# Patient Record
Sex: Female | Born: 1945 | Race: White | Hispanic: No | Marital: Married | Smoking: Former smoker
Health system: Southern US, Community
[De-identification: ages and names within clinical notes are randomized; demographics above are authoritative.]

## PROBLEM LIST (undated history)

## (undated) DIAGNOSIS — E559 Vitamin D deficiency, unspecified: Secondary | ICD-10-CM

## (undated) DIAGNOSIS — G309 Alzheimer's disease, unspecified: Secondary | ICD-10-CM

## (undated) DIAGNOSIS — E785 Hyperlipidemia, unspecified: Secondary | ICD-10-CM

## (undated) DIAGNOSIS — F0280 Dementia in other diseases classified elsewhere without behavioral disturbance: Secondary | ICD-10-CM

## (undated) DIAGNOSIS — M1990 Unspecified osteoarthritis, unspecified site: Secondary | ICD-10-CM

## (undated) DIAGNOSIS — E079 Disorder of thyroid, unspecified: Secondary | ICD-10-CM

## (undated) DIAGNOSIS — Z86010 Personal history of colonic polyps: Principal | ICD-10-CM

## (undated) HISTORY — DX: Disorder of thyroid, unspecified: E07.9

## (undated) HISTORY — DX: Vitamin D deficiency, unspecified: E55.9

## (undated) HISTORY — DX: Dementia in other diseases classified elsewhere, unspecified severity, without behavioral disturbance, psychotic disturbance, mood disturbance, and anxiety: F02.80

## (undated) HISTORY — DX: Personal history of colonic polyps: Z86.010

## (undated) HISTORY — DX: Alzheimer's disease, unspecified: G30.9

## (undated) HISTORY — DX: Unspecified osteoarthritis, unspecified site: M19.90

## (undated) HISTORY — DX: Hyperlipidemia, unspecified: E78.5

---

## 2009-07-12 ENCOUNTER — Encounter: Admission: RE | Admit: 2009-07-12 | Discharge: 2009-07-12 | Payer: Self-pay | Admitting: Family Medicine

## 2009-09-20 ENCOUNTER — Encounter (INDEPENDENT_AMBULATORY_CARE_PROVIDER_SITE_OTHER): Payer: Self-pay | Admitting: *Deleted

## 2009-09-22 ENCOUNTER — Ambulatory Visit: Payer: Self-pay | Admitting: Internal Medicine

## 2009-10-04 ENCOUNTER — Ambulatory Visit: Payer: Self-pay | Admitting: Internal Medicine

## 2009-10-04 DIAGNOSIS — Z860100 Personal history of colon polyps, unspecified: Secondary | ICD-10-CM | POA: Insufficient documentation

## 2009-10-04 HISTORY — PX: COLONOSCOPY: SHX174

## 2009-10-07 ENCOUNTER — Encounter: Payer: Self-pay | Admitting: Internal Medicine

## 2009-10-25 ENCOUNTER — Encounter: Admission: RE | Admit: 2009-10-25 | Discharge: 2009-10-25 | Payer: Self-pay | Admitting: Obstetrics and Gynecology

## 2010-05-02 ENCOUNTER — Encounter: Admission: RE | Admit: 2010-05-02 | Discharge: 2010-05-02 | Payer: Self-pay | Admitting: Obstetrics & Gynecology

## 2010-07-13 ENCOUNTER — Encounter: Admission: RE | Admit: 2010-07-13 | Discharge: 2010-07-13 | Payer: Self-pay | Admitting: Family Medicine

## 2010-07-17 ENCOUNTER — Encounter: Admission: RE | Admit: 2010-07-17 | Payer: Self-pay | Admitting: Endocrinology

## 2010-09-06 IMAGING — US US SOFT TISSUE HEAD/NECK
1 series · 14 of 20 positions shown · non-contrast
Comparison: None.

CLINICAL DATA: Enlarged thyroid

THYROID ULTRASOUND
TECHNIQUE: Ultrasound examination of the thyroid gland and
adjacent soft tissues was performed.

[Series 1: us soft tissue head/neck · 0.08mm/px · 14 of 20 slices shown]
[im 1/20]
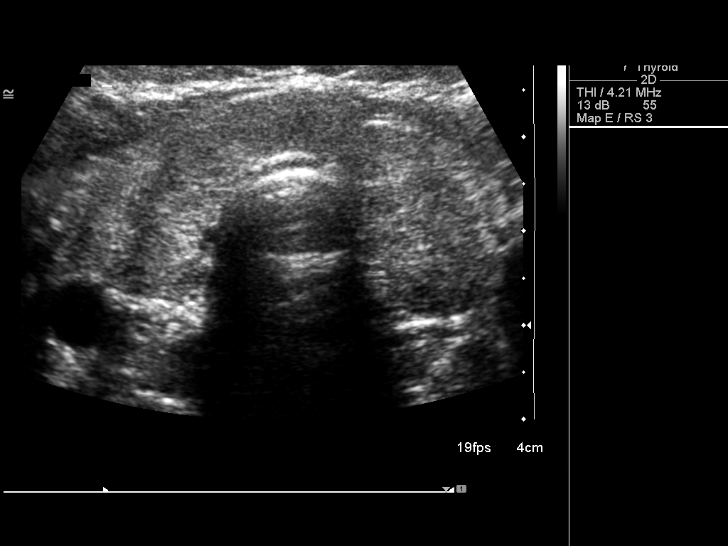
[im 3/20]
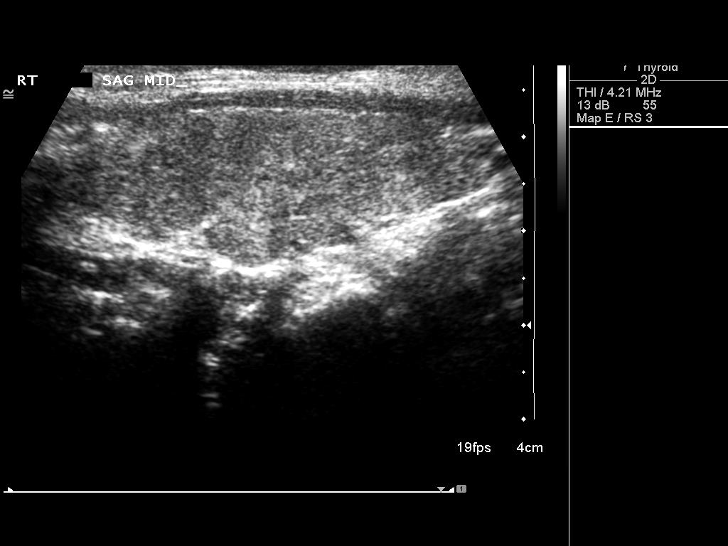
[im 4/20]
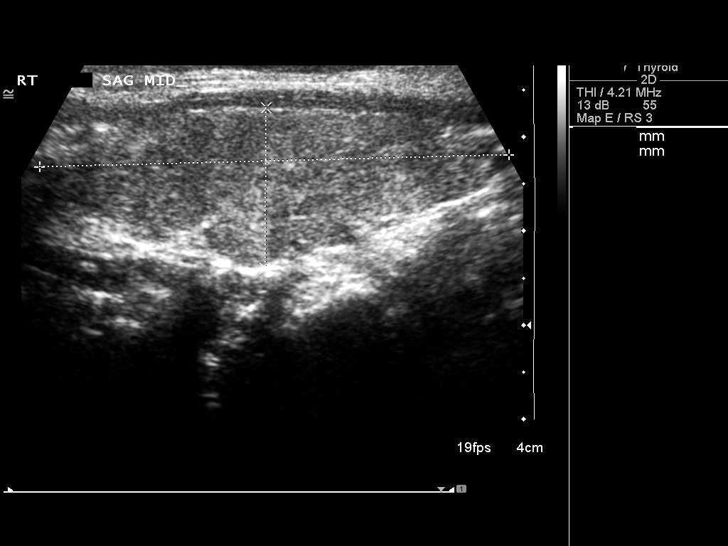
[im 6/20]
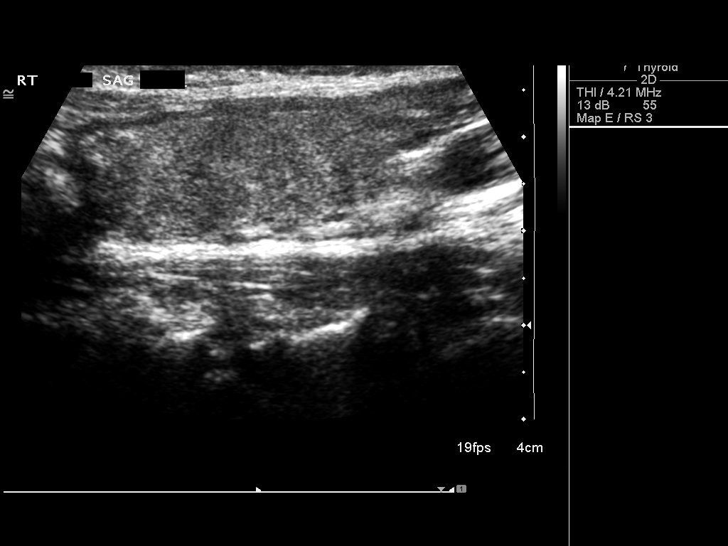
[im 7/20]
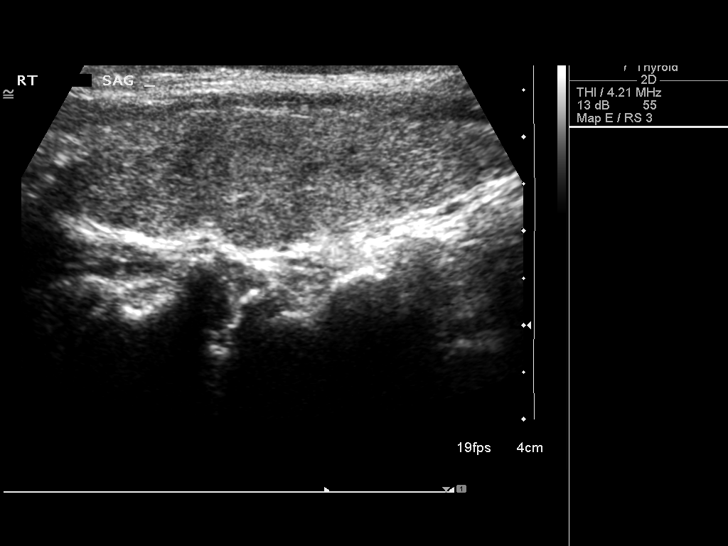
[im 8/20]
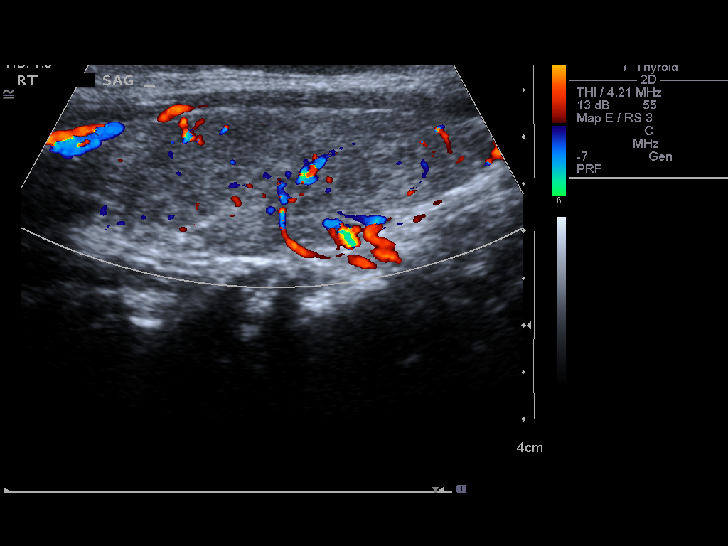
[im 10/20]
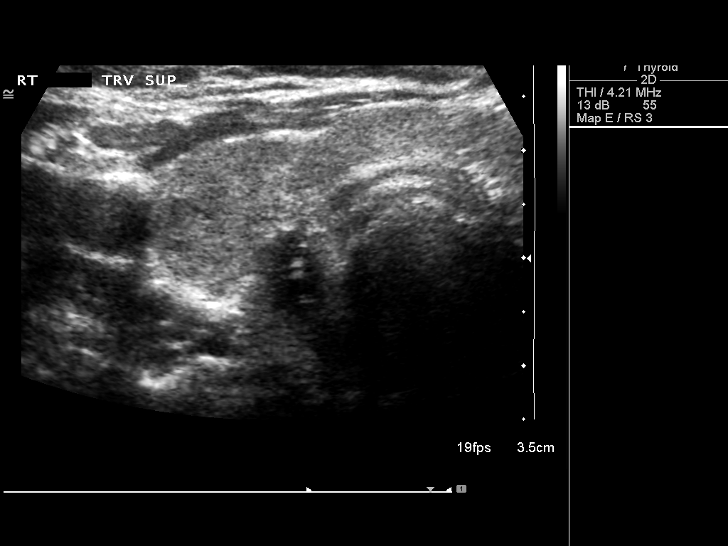
[im 11/20]
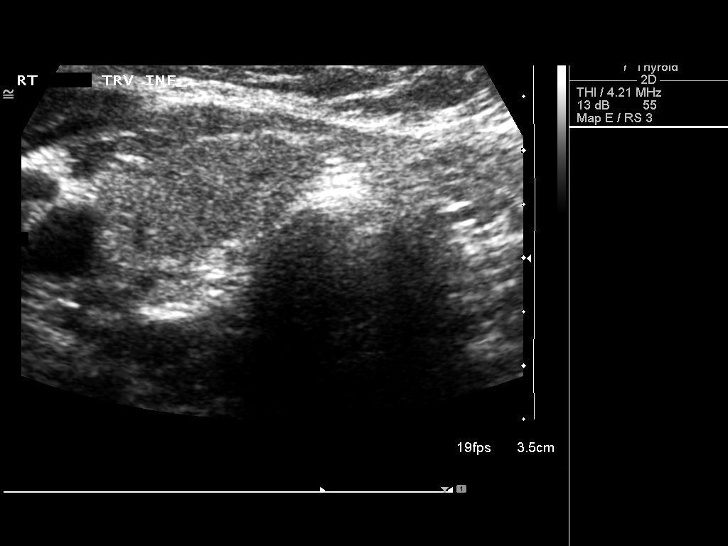
[im 13/20]
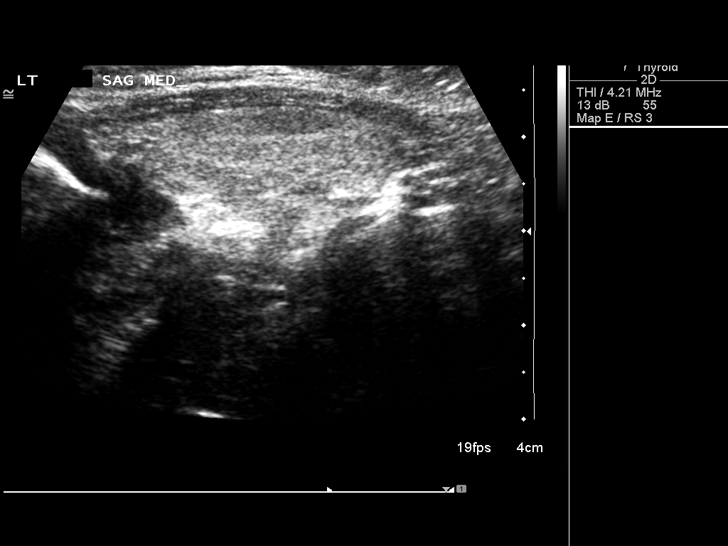
[im 14/20]
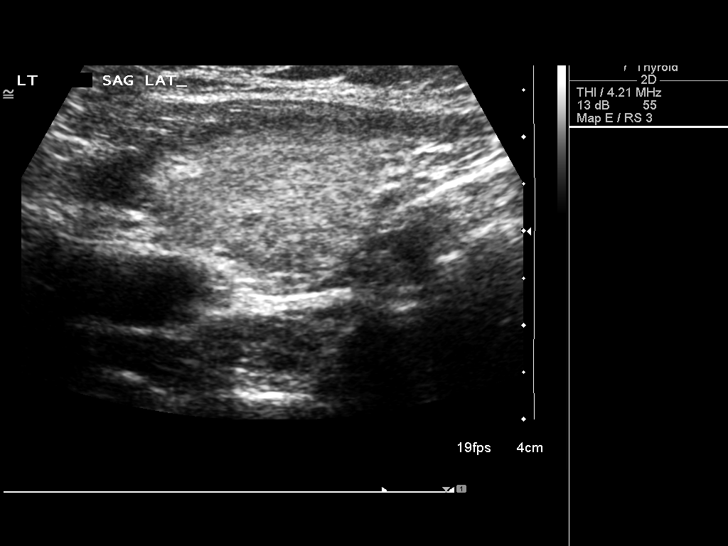
[im 16/20]
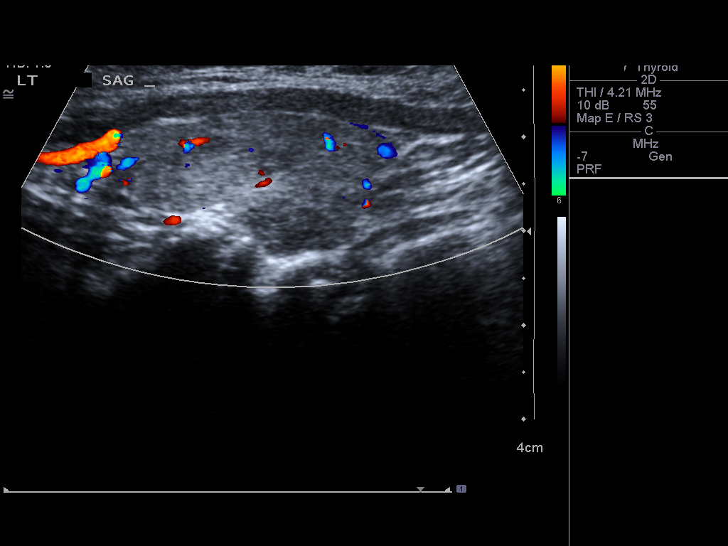
[im 17/20]
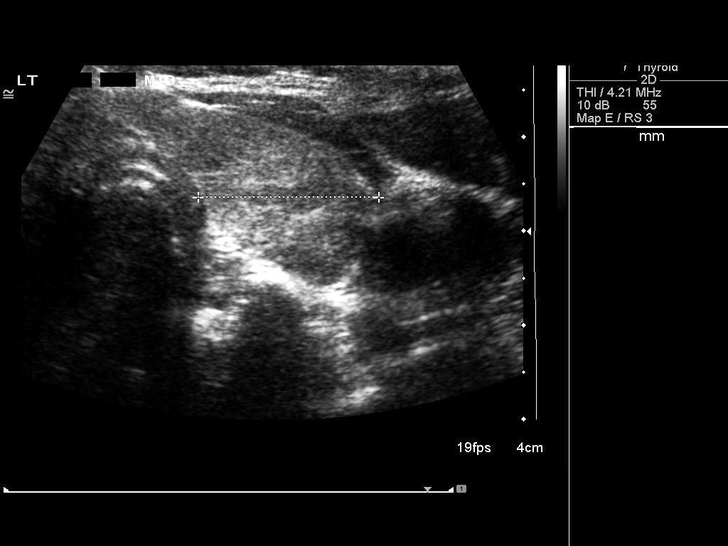
[im 18/20]
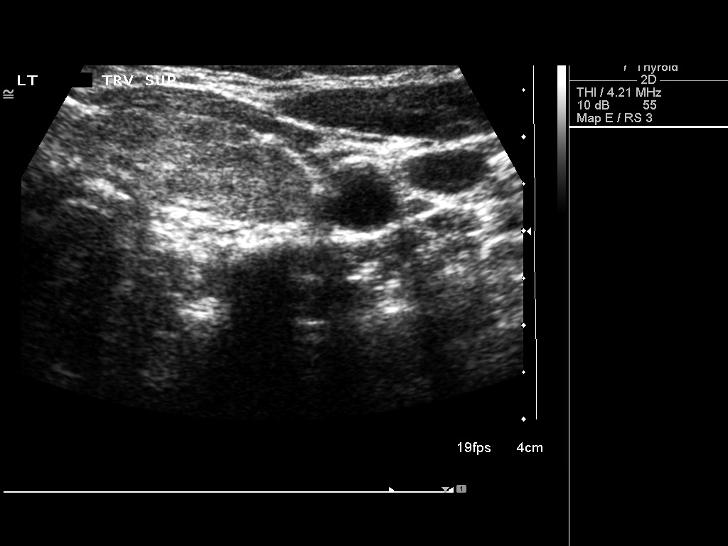
[im 20/20]
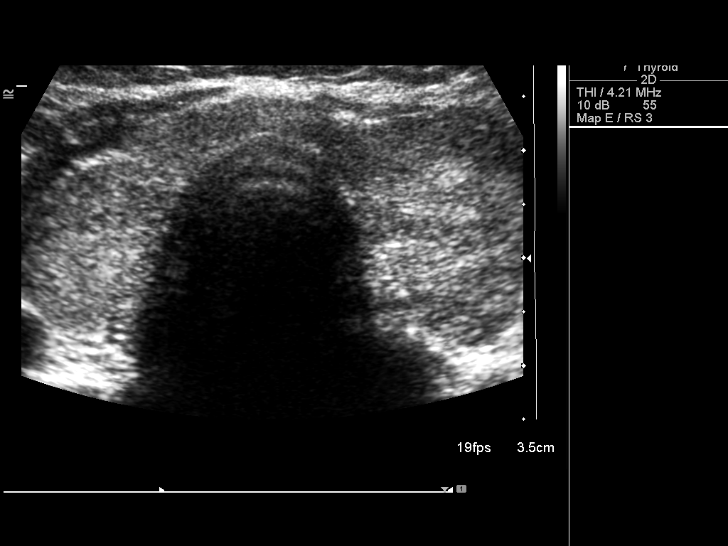

[14 of 20 positions shown; findings below may reference images not displayed]

FINDINGS: The isthmus is 6.8 mm in thickness without focal lesion.

Right lobe 17 x 18 x 15 mm, somewhat inhomogeneous in echotexture
without discrete lesion.

Left lobe 17 x 19 x 43 mm, similarly inhomogeneous in echotexture
without discrete lesion.
IMPRESSION: 1.  Mild thyromegaly without discrete or dominant mass.

## 2010-10-07 ENCOUNTER — Encounter: Payer: Self-pay | Admitting: Endocrinology

## 2010-10-17 NOTE — Letter (Signed)
Summary: [facility] Instructions  [facility]  [address] [place], [place] [address]   Phone: [phone]  Fax: [phone]       Meagan Schwartz    Dec 01, 1945    MRN: [id number]        Procedure Day /Date: 10/04/09   Tuesday     Arrival Time:  9:00am     Procedure Time: 10:00am     Location of Procedure:                    _ x_  [facility] (4th Floor)                        PREPARATION FOR COLONOSCOPY WITH MOVIPREP   Starting 5 days prior to your procedure _1/13/11 _ do not eat nuts, seeds, popcorn, corn, beans, peas,  salads, or any raw vegetables.  Do not take any fiber supplements (e.g. Metamucil, Citrucel, and Benefiber).  THE DAY BEFORE YOUR PROCEDURE         DATE: 10/03/09 DAY: Monday  1.  Drink clear liquids the entire day-NO SOLID FOOD  2.  Do not drink anything colored red or purple.  Avoid juices with pulp.  No orange juice.  3.  Drink at least 64 oz. (8 glasses) of fluid/clear liquids during the day to prevent dehydration and help the prep work efficiently.  CLEAR LIQUIDS INCLUDE: Water Jello Ice Popsicles Tea (sugar ok, no milk/cream) Powdered fruit flavored drinks Coffee (sugar ok, no milk/cream) Gatorade Juice: apple, white grape, white cranberry  Lemonade Clear bullion, consomm, broth Carbonated beverages (any kind) Strained chicken noodle soup Hard Candy                             4.  In the morning, mix first dose of MoviPrep solution:    Empty 1 Pouch A and 1 Pouch B into the disposable container    Add lukewarm drinking water to the top line of the container. Mix to dissolve    Refrigerate (mixed solution should be used within 24 hrs)  5.  Begin drinking the prep at 5:00 p.m. The MoviPrep container is divided by 4 marks.   Every 15 minutes drink the solution down to the next mark (approximately 8 oz) until the full liter is complete.   6.  Follow completed prep with 16 oz of clear liquid of your choice  (Nothing red or purple).  Continue to drink clear liquids until bedtime.  7.  Before going to bed, mix second dose of MoviPrep solution:    Empty 1 Pouch A and 1 Pouch B into the disposable container    Add lukewarm drinking water to the top line of the container. Mix to dissolve    Refrigerate  THE DAY OF YOUR PROCEDURE      DATE: 10/04/09 DAY: Tuesday  Beginning at  5:00a.m. (5 hours before procedure):         1. Every 15 minutes, drink the solution down to the next mark (approx 8 oz) until the full liter is complete.  2. Follow completed prep with 16 oz. of clear liquid of your choice.    3. You may drink clear liquids until 8:00AM (2 HOURS BEFORE PROCEDURE).   MEDICATION INSTRUCTIONS  Unless otherwise instructed, you should take regular prescription medications with a small sip of water   as early as possible the morning of  your procedure.         OTHER INSTRUCTIONS  You will need a responsible adult at least 64 years of age to accompany you and drive you home.   This person must remain in the waiting room during your procedure.  Wear loose fitting clothing that is easily removed.  Leave jewelry and other valuables at home.  However, you may wish to bring a book to read or  an iPod/MP3 player to listen to music as you wait for your procedure to start.  Remove all body piercing jewelry and leave at home.  Total time from sign-in until discharge is approximately 2-3 hours.  You should go home directly after your procedure and rest.  You can resume normal activities the  day after your procedure.  The day of your procedure you should not:   Drive   Make legal decisions   Operate machinery   Drink alcohol   Return to work  You will receive specific instructions about eating, activities and medications before you leave.    The above instructions have been reviewed and explained to me by Wyona Almas RN  September 22, 2009 10:21 AM     I fully  understand and can verbalize these instructions _____________________________ Date _________

## 2010-10-17 NOTE — Letter (Signed)
Summary: Patient Notice- Polyp Results  [facility]  [address] [place], [place] [address]   Phone: [phone]  Fax: [phone]        October 07, 2009 MRN: [id number]    [facility] [address] [place], [place]  [address]    Dear Ms. Burbridge,  The polyps removed from your colon were adenomatous. This means that they were pre-cancerous or that  they had the potential to change into cancer over time.  I recommend that you have a repeat colonoscopy in 5 years to determine if you have developed any new polyps over time. If you develop any new rectal bleeding, abdominal pain or significant bowel habit changes, please contact us before then.  Should you develop new or worsening symptoms of abdominal pain, bowel habit changes or bleeding from the rectum or bowels, please schedule an evaluation with either your primary care physician or with me.  Please call us if you are having persistent problems or have questions about your condition that have not been fully answered at this time.  Sincerely,  Iva Boop MD, [facility]  This letter has been electronically signed by your physician.  Appended Document: Patient Notice- Polyp Results Letter mailed 1.24.11.

## 2010-10-17 NOTE — Procedures (Signed)
Summary: Colonoscopy  Patient: Meagan Schwartz Note: All result statuses are Final unless otherwise noted.  Tests: (1) Colonoscopy (COL)   COL Colonoscopy           DONE     [facility]     [address]     [place], [place]  [address]           COLONOSCOPY PROCEDURE REPORT           PATIENT:  Meagan Schwartz, Meagan Schwartz  MR#:  [id number]     BIRTHDATE:  07-18-1946, 63 yrs. old  GENDER:  female           ENDOSCOPIST:  Iva Boop, MD, [facility]     Referred by:  Catha Gosselin, M.D.           PROCEDURE DATE:  10/04/2009     PROCEDURE:  Colonoscopy with biopsy and snare polypectomy     ASA CLASS:  Class I     INDICATIONS:  Routine Risk Screening (mother had colon cancer but     developed in her 80's)           MEDICATIONS:   Fentanyl 75 mcg IV, Versed 7 mg IV           DESCRIPTION OF PROCEDURE:   After the risks benefits and     alternatives of the procedure were thoroughly explained, informed     consent was obtained.  Digital rectal exam was performed and     revealed no abnormalities.   The LB CF-[id number] endoscope     was introduced through the anus and advanced to the cecum, which     was identified by both the appendix and ileocecal valve, without     limitations.  The quality of the prep was excellent, using     MoviPrep.  The instrument was then slowly withdrawn as the colon     was fully examined. Insertion: 5:00 minutes Withdrawal: 13:51     minutes.     <<PROCEDUREIMAGES>>           FINDINGS:  Three polyps were found. 7mm flat ascending colon     polyp, 3 mm polyps in ascending and splenic flexure areas. The     polyps were removed using cold biopsy forceps and also  snared     without cautery. Retrieval was successful. snare polyp  Severe     diverticulosis was found in the sigmoid colon.  This was otherwise     a normal examination of the colon.   Retroflexed views in the     rectum revealed no abnormalities.    The scope was then withdrawn     from the patient  and the procedure completed.           COMPLICATIONS:  None           ENDOSCOPIC IMPRESSION:     1) Three polyps removed, maximum size 7 mm     2) Severe diverticulosis in the sigmoid colon     3) Otherwise normal examination, excellent prep           REPEAT EXAM:  In for Colonoscopy, pending biopsy results.           Iva Boop, MD, [facility]           CC:  Catha Gosselin, MD     The Patient           n.     eSIGNED:  Iva Boop at 10/04/2009 11:01 AM           [employer], Valentina Gu, [id number]  Note: An exclamation mark (!) indicates a result that was not dispersed into the flowsheet. Document Creation Date: 10/04/2009 11:01 AM _______________________________________________________________________  (1) Order result status: Final Collection or observation date-time: 10/04/2009 10:52 Requested date-time:  Receipt date-time:  Reported date-time:  Referring Physician:   Ordering Physician: Stan Head [phone]) Specimen Source:  Source: Launa Grill Order Number: [id number] Lab site:   Appended Document: Colonoscopy     Procedures Next Due Date:    Colonoscopy: 10/2014

## 2010-10-17 NOTE — Miscellaneous (Signed)
Summary: [facility] Previsit/prep  Clinical Lists Changes  Medications: Added new medication of MOVIPREP 100 GM  SOLR (PEG-KCL-NACL-NASULF-NA ASC-C) As per prep instructions. - Signed Rx of MOVIPREP 100 GM  SOLR (PEG-KCL-NACL-NASULF-NA ASC-C) As per prep instructions.;  #1 x 0;  Signed;  Entered by: Wyona Almas RN;  Authorized by: Iva Boop MD, [facility];  Method used: Electronically to [facility]  [phone]*, [address], [place], [place], [place]  [address], Ph: [phone] or [phone], Fax: [phone] Observations: Added new observation of NKA: T (09/22/2009 10:00)    Prescriptions: MOVIPREP 100 GM  SOLR (PEG-KCL-NACL-NASULF-NA ASC-C) As per prep instructions.  #1 x 0   Entered by:   Wyona Almas RN   Authorized by:   Iva Boop MD, [facility]   Signed by:   Wyona Almas RN on 09/22/2009   Method used:   Electronically to        [facility]  [phone]* (retail)       [address]       [place], [place]  [address]       Ph: [phone] or [phone]       Fax: [phone]   RxID:   [id number]

## 2011-06-21 ENCOUNTER — Other Ambulatory Visit: Payer: Self-pay | Admitting: Family Medicine

## 2011-06-21 DIAGNOSIS — Z1231 Encounter for screening mammogram for malignant neoplasm of breast: Secondary | ICD-10-CM

## 2011-07-17 ENCOUNTER — Ambulatory Visit
Admission: RE | Admit: 2011-07-17 | Discharge: 2011-07-17 | Disposition: A | Discharge status: Home / Self Care | Payer: BC Managed Care – PPO | Source: Ambulatory Visit | Attending: Family Medicine | Admitting: Family Medicine

## 2011-07-17 DIAGNOSIS — Z1231 Encounter for screening mammogram for malignant neoplasm of breast: Secondary | ICD-10-CM

## 2011-10-29 DIAGNOSIS — Z01419 Encounter for gynecological examination (general) (routine) without abnormal findings: Secondary | ICD-10-CM | POA: Diagnosis not present

## 2011-10-29 DIAGNOSIS — Z124 Encounter for screening for malignant neoplasm of cervix: Secondary | ICD-10-CM | POA: Diagnosis not present

## 2011-10-29 DIAGNOSIS — E559 Vitamin D deficiency, unspecified: Secondary | ICD-10-CM | POA: Diagnosis not present

## 2011-10-30 ENCOUNTER — Other Ambulatory Visit: Payer: Self-pay | Admitting: Obstetrics & Gynecology

## 2011-10-30 DIAGNOSIS — E049 Nontoxic goiter, unspecified: Secondary | ICD-10-CM

## 2011-11-05 ENCOUNTER — Inpatient Hospital Stay: Admission: RE | Admit: 2011-11-05 | Payer: BC Managed Care – PPO | Source: Ambulatory Visit

## 2012-01-01 DIAGNOSIS — E559 Vitamin D deficiency, unspecified: Secondary | ICD-10-CM | POA: Diagnosis not present

## 2012-01-01 DIAGNOSIS — Z79899 Other long term (current) drug therapy: Secondary | ICD-10-CM | POA: Diagnosis not present

## 2012-01-01 DIAGNOSIS — E039 Hypothyroidism, unspecified: Secondary | ICD-10-CM | POA: Diagnosis not present

## 2012-01-01 DIAGNOSIS — E785 Hyperlipidemia, unspecified: Secondary | ICD-10-CM | POA: Diagnosis not present

## 2012-01-02 DIAGNOSIS — E039 Hypothyroidism, unspecified: Secondary | ICD-10-CM | POA: Diagnosis not present

## 2012-01-02 DIAGNOSIS — Z0000 Encounter for general adult medical examination without abnormal findings: Secondary | ICD-10-CM | POA: Diagnosis not present

## 2012-01-02 DIAGNOSIS — Z23 Encounter for immunization: Secondary | ICD-10-CM | POA: Diagnosis not present

## 2012-01-02 DIAGNOSIS — Z79899 Other long term (current) drug therapy: Secondary | ICD-10-CM | POA: Diagnosis not present

## 2012-01-02 DIAGNOSIS — E785 Hyperlipidemia, unspecified: Secondary | ICD-10-CM | POA: Diagnosis not present

## 2012-01-02 DIAGNOSIS — D126 Benign neoplasm of colon, unspecified: Secondary | ICD-10-CM | POA: Diagnosis not present

## 2012-01-02 DIAGNOSIS — E559 Vitamin D deficiency, unspecified: Secondary | ICD-10-CM | POA: Diagnosis not present

## 2012-05-07 ENCOUNTER — Other Ambulatory Visit: Payer: Self-pay | Admitting: Family Medicine

## 2012-05-07 DIAGNOSIS — Z1231 Encounter for screening mammogram for malignant neoplasm of breast: Secondary | ICD-10-CM

## 2012-06-18 ENCOUNTER — Ambulatory Visit: Payer: BC Managed Care – PPO

## 2012-07-17 ENCOUNTER — Ambulatory Visit
Admission: RE | Admit: 2012-07-17 | Discharge: 2012-07-17 | Disposition: A | Discharge status: Home / Self Care | Payer: Medicare Other | Source: Ambulatory Visit | Attending: Family Medicine | Admitting: Family Medicine

## 2012-07-17 DIAGNOSIS — Z1231 Encounter for screening mammogram for malignant neoplasm of breast: Secondary | ICD-10-CM

## 2012-07-18 ENCOUNTER — Ambulatory Visit: Payer: BC Managed Care – PPO

## 2012-07-28 DIAGNOSIS — E039 Hypothyroidism, unspecified: Secondary | ICD-10-CM | POA: Diagnosis not present

## 2012-12-24 DIAGNOSIS — E785 Hyperlipidemia, unspecified: Secondary | ICD-10-CM | POA: Diagnosis not present

## 2012-12-24 DIAGNOSIS — E039 Hypothyroidism, unspecified: Secondary | ICD-10-CM | POA: Diagnosis not present

## 2012-12-24 DIAGNOSIS — Z79899 Other long term (current) drug therapy: Secondary | ICD-10-CM | POA: Diagnosis not present

## 2013-01-06 DIAGNOSIS — Z0000 Encounter for general adult medical examination without abnormal findings: Secondary | ICD-10-CM | POA: Diagnosis not present

## 2013-01-06 DIAGNOSIS — E039 Hypothyroidism, unspecified: Secondary | ICD-10-CM | POA: Diagnosis not present

## 2013-01-06 DIAGNOSIS — D126 Benign neoplasm of colon, unspecified: Secondary | ICD-10-CM | POA: Diagnosis not present

## 2013-01-06 DIAGNOSIS — Z79899 Other long term (current) drug therapy: Secondary | ICD-10-CM | POA: Diagnosis not present

## 2013-01-06 DIAGNOSIS — E785 Hyperlipidemia, unspecified: Secondary | ICD-10-CM | POA: Diagnosis not present

## 2013-06-08 ENCOUNTER — Other Ambulatory Visit: Payer: Self-pay

## 2013-06-08 DIAGNOSIS — Z1231 Encounter for screening mammogram for malignant neoplasm of breast: Secondary | ICD-10-CM

## 2013-07-15 DIAGNOSIS — H2510 Age-related nuclear cataract, unspecified eye: Secondary | ICD-10-CM | POA: Diagnosis not present

## 2013-07-15 DIAGNOSIS — Z23 Encounter for immunization: Secondary | ICD-10-CM | POA: Diagnosis not present

## 2013-07-20 ENCOUNTER — Ambulatory Visit
Admission: RE | Admit: 2013-07-20 | Discharge: 2013-07-20 | Disposition: A | Discharge status: Home / Self Care | Payer: Medicare Other | Source: Ambulatory Visit

## 2013-07-20 DIAGNOSIS — Z1231 Encounter for screening mammogram for malignant neoplasm of breast: Secondary | ICD-10-CM

## 2013-07-23 ENCOUNTER — Other Ambulatory Visit: Payer: Self-pay | Admitting: Family Medicine

## 2013-07-23 DIAGNOSIS — R928 Other abnormal and inconclusive findings on diagnostic imaging of breast: Secondary | ICD-10-CM

## 2013-07-28 DIAGNOSIS — E039 Hypothyroidism, unspecified: Secondary | ICD-10-CM | POA: Diagnosis not present

## 2013-08-03 ENCOUNTER — Ambulatory Visit
Admission: RE | Admit: 2013-08-03 | Discharge: 2013-08-03 | Disposition: A | Discharge status: Home / Self Care | Payer: Medicare Other | Source: Ambulatory Visit | Attending: Family Medicine | Admitting: Family Medicine

## 2013-08-03 DIAGNOSIS — R928 Other abnormal and inconclusive findings on diagnostic imaging of breast: Secondary | ICD-10-CM

## 2013-12-31 ENCOUNTER — Other Ambulatory Visit: Payer: Self-pay | Admitting: Family Medicine

## 2013-12-31 DIAGNOSIS — R921 Mammographic calcification found on diagnostic imaging of breast: Secondary | ICD-10-CM

## 2014-01-14 DIAGNOSIS — E559 Vitamin D deficiency, unspecified: Secondary | ICD-10-CM | POA: Diagnosis not present

## 2014-01-14 DIAGNOSIS — Z23 Encounter for immunization: Secondary | ICD-10-CM | POA: Diagnosis not present

## 2014-01-14 DIAGNOSIS — D126 Benign neoplasm of colon, unspecified: Secondary | ICD-10-CM | POA: Diagnosis not present

## 2014-01-14 DIAGNOSIS — E785 Hyperlipidemia, unspecified: Secondary | ICD-10-CM | POA: Diagnosis not present

## 2014-01-14 DIAGNOSIS — Z79899 Other long term (current) drug therapy: Secondary | ICD-10-CM | POA: Diagnosis not present

## 2014-01-14 DIAGNOSIS — Z0000 Encounter for general adult medical examination without abnormal findings: Secondary | ICD-10-CM | POA: Diagnosis not present

## 2014-01-14 DIAGNOSIS — E039 Hypothyroidism, unspecified: Secondary | ICD-10-CM | POA: Diagnosis not present

## 2014-01-14 DIAGNOSIS — R82998 Other abnormal findings in urine: Secondary | ICD-10-CM | POA: Diagnosis not present

## 2014-01-14 DIAGNOSIS — Z1331 Encounter for screening for depression: Secondary | ICD-10-CM | POA: Diagnosis not present

## 2014-02-02 ENCOUNTER — Ambulatory Visit
Admission: RE | Admit: 2014-02-02 | Discharge: 2014-02-02 | Disposition: A | Discharge status: Home / Self Care | Payer: Medicare Other | Source: Ambulatory Visit | Attending: Family Medicine | Admitting: Family Medicine

## 2014-02-02 DIAGNOSIS — R921 Mammographic calcification found on diagnostic imaging of breast: Secondary | ICD-10-CM

## 2014-02-02 DIAGNOSIS — R928 Other abnormal and inconclusive findings on diagnostic imaging of breast: Secondary | ICD-10-CM | POA: Diagnosis not present

## 2014-02-18 ENCOUNTER — Encounter: Payer: Self-pay | Admitting: Nurse Practitioner

## 2014-02-18 ENCOUNTER — Ambulatory Visit (INDEPENDENT_AMBULATORY_CARE_PROVIDER_SITE_OTHER): Payer: Medicare Other | Admitting: Nurse Practitioner

## 2014-02-18 VITALS — BP 140/86 | HR 68 | Ht 62.25 in | Wt 140.0 lb

## 2014-02-18 DIAGNOSIS — L723 Sebaceous cyst: Secondary | ICD-10-CM

## 2014-02-18 DIAGNOSIS — N760 Acute vaginitis: Secondary | ICD-10-CM

## 2014-02-18 MED ORDER — FLUCONAZOLE 150 MG PO TABS: 150.0000 mg | ORAL_TABLET | Freq: Once | ORAL | Status: DC

## 2014-02-18 MED ORDER — ESTRADIOL 0.1 MG/GM VA CREA: TOPICAL_CREAM | VAGINAL | Status: DC

## 2014-02-18 MED ORDER — NYSTATIN-TRIAMCINOLONE 100000-0.1 UNIT/GM-% EX OINT: 1.0000 "application " | TOPICAL_OINTMENT | Freq: Two times a day (BID) | CUTANEOUS | Status: DC

## 2014-02-18 NOTE — Progress Notes (Signed)
Subjective:     Patient ID: Meagan Schwartz, female   DOB: Apr 22, 1946, 68 y.o.   MRN: [id number]  HPI This 68 yo WM Fe complains of 'bumps' around the anus.  Symptoms started about a month ago with irritation.  She then noted an increase of itching in the right groin with redness despite using OTC creams.  The bumps would not come to a head even though they were white looking.  No changes with bowel habit.  No urinary symptoms.  No vaginal discharge. Going to [place] for 3 months for the summer.  She does not want to be gone and something flare up or worsen.  After return will come in for AEX.  She does have atrophic vaginitis and occasionally will use Estrace vaginal cream.  Review of Systems  Constitutional: Negative for fever, chills, fatigue and unexpected weight change.  Respiratory: Negative.   Cardiovascular: Negative.   Gastrointestinal: Negative for nausea, vomiting, abdominal pain, diarrhea, constipation, blood in stool, abdominal distention, anal bleeding and rectal pain.  Genitourinary: Positive for dyspareunia. Negative for vaginal discharge.  Musculoskeletal: Negative.   Skin: Positive for color change and rash.  Neurological: Negative.   Psychiatric/Behavioral: Negative.        Objective:   Physical Exam  Constitutional: She is oriented to person, place, and time. She appears well-developed and well-nourished. No distress.  Abdominal: Soft. She exhibits no distension. There is no tenderness. There is no rebound and no guarding.  Genitourinary:  Yeast infection of the right groin with pealing of the skin. No vaginal discharge but there are other changes of atrophy. Around the perianal area the yeast extends all the way to between her cheeks.  There are some small sebaceous cyst with white exudate that is expressed.  No signs of HPV or HSV.  Neurological: She is alert and oriented to person, place, and time.  Psychiatric: She has a normal mood and affect. Her behavior is normal.  Judgment and thought content normal.       Assessment:     Yeast vulva vaginitis Small sebaceous cyst around the perianal area that maybe irritated from the yeast    Plan:     Will start her on Diflucan 150 mg X 2 doses with 1 refill due to her traveling When she return in 3 months to return for AEX Refill Estrace vaginal cream is given X 1 pending her next AEX.  She is given counseling about risk of DVT, CVA, cancer, etc. Last Mammo done 02/02/14.

## 2014-02-18 NOTE — Patient Instructions (Signed)
Yeast Infection of the Skin Some yeast on the skin is normal, but sometimes it causes an infection. If you have a yeast infection, it shows up as white or light brown patches on brown skin. You can see it better in the summer on tan skin. It causes light-colored holes in your suntan. It can happen on any area of the body. This cannot be passed from person to person. HOME CARE  Scrub your skin daily with a dandruff shampoo. Your rash may take a couple weeks to get well.  Do not scratch or itch the rash. GET HELP RIGHT AWAY IF:   You get another infection from scratching. The skin may get warm, red, and may ooze fluid.  The infection does not seem to be getting better. MAKE SURE YOU:  Understand these instructions.  Will watch your condition.  Will get help right away if you are not doing well or get worse. Document Released: 08/16/2008 Document Revised: 11/26/2011 Document Reviewed: 08/16/2008 Exit[employer] Patient Information 2014 [employer].  

## 2014-02-28 NOTE — Progress Notes (Signed)
Encounter reviewed by Dr. Geriann Lafont Silva.  

## 2014-06-01 ENCOUNTER — Other Ambulatory Visit: Payer: Self-pay | Admitting: Family Medicine

## 2014-06-01 DIAGNOSIS — R921 Mammographic calcification found on diagnostic imaging of breast: Secondary | ICD-10-CM

## 2014-06-08 ENCOUNTER — Ambulatory Visit (INDEPENDENT_AMBULATORY_CARE_PROVIDER_SITE_OTHER): Payer: Medicare Other | Admitting: Nurse Practitioner

## 2014-06-08 ENCOUNTER — Encounter: Payer: Self-pay | Admitting: Nurse Practitioner

## 2014-06-08 VITALS — BP 140/84 | HR 60 | Ht 62.25 in | Wt 143.0 lb

## 2014-06-08 DIAGNOSIS — Z01419 Encounter for gynecological examination (general) (routine) without abnormal findings: Secondary | ICD-10-CM

## 2014-06-08 DIAGNOSIS — N952 Postmenopausal atrophic vaginitis: Secondary | ICD-10-CM

## 2014-06-08 DIAGNOSIS — Z124 Encounter for screening for malignant neoplasm of cervix: Secondary | ICD-10-CM | POA: Diagnosis not present

## 2014-06-08 DIAGNOSIS — Z0000 Encounter for general adult medical examination without abnormal findings: Secondary | ICD-10-CM

## 2014-06-08 DIAGNOSIS — E2839 Other primary ovarian failure: Secondary | ICD-10-CM

## 2014-06-08 MED ORDER — NYSTATIN-TRIAMCINOLONE 100000-0.1 UNIT/GM-% EX OINT: 1.0000 "application " | TOPICAL_OINTMENT | Freq: Two times a day (BID) | CUTANEOUS | Status: DC

## 2014-06-08 MED ORDER — ESTRADIOL 0.1 MG/GM VA CREA: TOPICAL_CREAM | VAGINAL | Status: DC

## 2014-06-08 NOTE — Progress Notes (Signed)
Encounter reviewed by Dr. Jeaneen Cala Silva.  

## 2014-06-08 NOTE — Patient Instructions (Addendum)

## 2014-06-08 NOTE — Progress Notes (Signed)
Patient ID: Meagan Schwartz, female   DOB: [birth year], 68 y.o.   MRN: [id number] 68 y.o. G87P2002 Married Caucasian Fe here for annual exam.  Now having yeast infection in the groin and treated with Nystatin.  Some improvement with vaginal estrogen for atrophy.  She feels that she now has yeast all over her skin.  Several areas of 'redness and itching".  She is going to see dermatologist tomorrow for an evaluation.  Patient's last menstrual period was 09/17/1996.          Sexually active: No.  The current method of family planning is abstinence and post menopausal status.    Exercising: Yes.    Home exercise routine includes walking and stationary bike. Smoker:  no  Health Maintenance: Pap:  10/23/10, WNL MMG:  02/02/14, Bi-Rads 3:  Probably benign calcifications, repeat in 07/2014 Colonoscopy:  09/2009, 3 polyps repeat in 5 years BMD:   09/2009, 0.7/0.0 TDaP:  08/2009 Shingles: ? 2014/2015 Pneumonia: Labs:  PCP   reports that she has never smoked. She has never used smokeless tobacco. She reports that she drinks about 4.2 ounces of alcohol per week. She reports that she does not use illicit drugs.  Past Medical History  Diagnosis Date  . Hyperlipidemia   . 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 Vitamin D deficiency     History reviewed. No pertinent past surgical history.  Current Outpatient Prescriptions  Medication Sig Dispense Refill  . estradiol (ESTRACE) 0.1 MG/GM vaginal cream Use 1/2 g vaginally twice weekly  42.5 g  3  . fluconazole (DIFLUCAN) 150 MG tablet Take 1 tablet (150 mg total) by mouth once. Take one tablet.  Repeat in 48 hours if symptoms are not completely resolved.  2 tablet  1  . levothyroxine (SYNTHROID, LEVOTHROID) 75 MCG tablet Take 1 tablet by mouth daily.      Marland Kitchen nystatin-triamcinolone ointment (MYCOLOG) Apply 1 application topically 2 (two) times daily.  30 g  1   No current facility-administered medications for this visit.    Family History  Problem  Relation Age of Onset  . Colon cancer Mother   . Alzheimer's disease Father   . Leukemia Brother   . Breast cancer Maternal Grandmother   . Breast cancer Paternal Grandmother   . Paget's disease of bone Cousin   . Paget's disease of bone Cousin     ROS:  Pertinent items are noted in HPI.  Otherwise, a comprehensive ROS was negative.  Exam:   BP 140/84  Pulse 60  Ht 5' 2.25" (1.581 m)  Wt 143 lb (64.864 kg)  BMI 25.95 kg/m2  LMP 09/17/1996 Height: 5' 2.25" (158.1 cm)  Ht Readings from Last 3 Encounters:  06/08/14 5' 2.25" (1.581 m)  02/18/14 5' 2.25" (1.581 m)    General appearance: alert, cooperative and appears stated age Head: Normocephalic, without obvious abnormality, atraumatic Neck: no adenopathy, supple, symmetrical, trachea midline and thyroid normal to inspection and palpation Lungs: clear to auscultation bilaterally Breasts: normal appearance, no masses or tenderness Heart: regular rate and rhythm Abdomen: soft, non-tender; no masses,  no organomegaly Extremities: extremities normal, atraumatic, no cyanosis or edema Skin: Skin color, texture, turgor normal. No rashes or lesions Lymph nodes: Cervical, supraclavicular, and axillary nodes normal. No abnormal inguinal nodes palpated Neurologic: Grossly normal   Pelvic: External genitalia:  no lesions              Urethra:  normal appearing urethra with no masses, tenderness  or lesions              Bartholin's and Skene's: normal                 Vagina: normal appearing vagina with normal color and discharge, no lesions              Cervix: anteverted              Pap taken: Yes.   Bimanual Exam:  Uterus:  normal size, contour, position, consistency, mobility, non-tender              Adnexa: no mass, fullness, tenderness               Rectovaginal: Confirms               Anus:  normal sphincter tone, no lesions  A:  Well Woman with normal exam  Postmenopausal no HRT  History of hypothyroid PCP is  following  Atrophic vaginitis   P:   Reviewed health and wellness pertinent to exam  Pap smear taken today  Mammogram is due 11/15 for repeat diagnostic  Note sent for BMD  Will refill Estrogen vaginal cream for a year  Will refill Nystatin cream to use prn counseled on breast self exam, mammography screening, osteoporosis, adequate intake of calcium and vitamin D, diet and exercise, Kegel's exercises return annually or prn  An After Visit Summary was printed and given to the patient.

## 2014-06-09 DIAGNOSIS — L738 Other specified follicular disorders: Secondary | ICD-10-CM | POA: Diagnosis not present

## 2014-06-09 DIAGNOSIS — L219 Seborrheic dermatitis, unspecified: Secondary | ICD-10-CM | POA: Diagnosis not present

## 2014-06-09 DIAGNOSIS — L538 Other specified erythematous conditions: Secondary | ICD-10-CM | POA: Diagnosis not present

## 2014-06-09 LAB — IPS PAP SMEAR ONLY

## 2014-06-13 ENCOUNTER — Encounter: Payer: Self-pay | Admitting: Internal Medicine

## 2014-07-19 ENCOUNTER — Encounter: Payer: Self-pay | Admitting: Nurse Practitioner

## 2014-07-19 DIAGNOSIS — H2513 Age-related nuclear cataract, bilateral: Secondary | ICD-10-CM | POA: Diagnosis not present

## 2014-07-19 DIAGNOSIS — H25013 Cortical age-related cataract, bilateral: Secondary | ICD-10-CM | POA: Diagnosis not present

## 2014-07-21 ENCOUNTER — Ambulatory Visit
Admission: RE | Admit: 2014-07-21 | Discharge: 2014-07-21 | Disposition: A | Discharge status: Home / Self Care | Payer: Medicare Other | Source: Ambulatory Visit | Attending: Family Medicine | Admitting: Family Medicine

## 2014-07-21 DIAGNOSIS — R921 Mammographic calcification found on diagnostic imaging of breast: Secondary | ICD-10-CM | POA: Diagnosis not present

## 2014-07-22 ENCOUNTER — Inpatient Hospital Stay: Admission: RE | Admit: 2014-07-22 | Payer: Medicare Other | Source: Ambulatory Visit

## 2014-07-28 DIAGNOSIS — E039 Hypothyroidism, unspecified: Secondary | ICD-10-CM | POA: Diagnosis not present

## 2014-07-29 DIAGNOSIS — Z23 Encounter for immunization: Secondary | ICD-10-CM | POA: Diagnosis not present

## 2014-08-04 DIAGNOSIS — L219 Seborrheic dermatitis, unspecified: Secondary | ICD-10-CM | POA: Diagnosis not present

## 2014-08-04 DIAGNOSIS — L719 Rosacea, unspecified: Secondary | ICD-10-CM | POA: Diagnosis not present

## 2014-08-04 DIAGNOSIS — J309 Allergic rhinitis, unspecified: Secondary | ICD-10-CM | POA: Diagnosis not present

## 2014-09-17 HISTORY — PX: BREAST BIOPSY: SHX20

## 2014-10-05 ENCOUNTER — Encounter: Payer: Self-pay | Admitting: Internal Medicine

## 2014-10-12 ENCOUNTER — Encounter: Payer: Self-pay | Admitting: Internal Medicine

## 2014-11-03 DIAGNOSIS — E039 Hypothyroidism, unspecified: Secondary | ICD-10-CM | POA: Diagnosis not present

## 2014-12-13 ENCOUNTER — Ambulatory Visit (AMBULATORY_SURGERY_CENTER): Payer: Self-pay | Admitting: *Deleted

## 2014-12-13 VITALS — Ht 64.0 in | Wt 142.5 lb

## 2014-12-13 DIAGNOSIS — Z86010 Personal history of colonic polyps: Secondary | ICD-10-CM

## 2014-12-13 DIAGNOSIS — Z800 Family history of malignant neoplasm of digestive organs: Secondary | ICD-10-CM

## 2014-12-13 NOTE — Progress Notes (Signed)
No egg or soy allergy No home 02 No diet pills No issues with past sedation for last colon 2011

## 2014-12-27 ENCOUNTER — Encounter: Payer: Self-pay | Admitting: Internal Medicine

## 2014-12-27 ENCOUNTER — Ambulatory Visit (AMBULATORY_SURGERY_CENTER): Payer: Medicare Other | Admitting: Internal Medicine

## 2014-12-27 VITALS — BP 112/70 | HR 53 | Temp 97.0°F | Resp 20 | Ht 64.0 in | Wt 142.0 lb

## 2014-12-27 DIAGNOSIS — Z860100 Personal history of colon polyps, unspecified: Secondary | ICD-10-CM

## 2014-12-27 DIAGNOSIS — D122 Benign neoplasm of ascending colon: Secondary | ICD-10-CM | POA: Diagnosis not present

## 2014-12-27 HISTORY — DX: Personal history of colonic polyps: Z86.010

## 2014-12-27 HISTORY — DX: Personal history of colon polyps, unspecified: Z86.0100

## 2014-12-27 MED ORDER — SODIUM CHLORIDE 0.9 % IV SOLN: 500.0000 mL | INTRAVENOUS | Status: DC

## 2014-12-27 NOTE — Patient Instructions (Addendum)
I found and removed 2 tiny polyps that look benign. I will let you know pathology results and when to have another routine colonoscopy by mail.  You also have a condition called diverticulosis - common and not usually a problem. Please read the handout provided.  I appreciate the opportunity to care for you. Gatha Mayer, MD, [facility]  Discharge instructions given. Handouts on polyps and diverticulosis. Resume previous medications. YOU HAD AN ENDOSCOPIC PROCEDURE TODAY AT [facility]:   Refer to the procedure report that was given to you for any specific questions about what was found during the examination.  If the procedure report does not answer your questions, please call your gastroenterologist to clarify.  If you requested that your care partner not be given the details of your procedure findings, then the procedure report has been included in a sealed envelope for you to review at your convenience later.  YOU SHOULD EXPECT: Some feelings of bloating in the abdomen. Passage of more gas than usual.  Walking can help get rid of the air that was put into your GI tract during the procedure and reduce the bloating. If you had a lower endoscopy (such as a colonoscopy or flexible sigmoidoscopy) you may notice spotting of blood in your stool or on the toilet paper. If you underwent a bowel prep for your procedure, you may not have a normal bowel movement for a few days.  Please Note:  You might notice some irritation and congestion in your nose or some drainage.  This is from the oxygen used during your procedure.  There is no need for concern and it should clear up in a day or so.  SYMPTOMS TO REPORT IMMEDIATELY:   Following lower endoscopy (colonoscopy or flexible sigmoidoscopy):  Excessive amounts of blood in the stool  Significant tenderness or worsening of abdominal pains  Swelling of the abdomen that is new, acute  Fever of 100F or higher   For urgent or  emergent issues, a gastroenterologist can be reached at any hour by calling [phone].   DIET: Your first meal following the procedure should be a small meal and then it is ok to progress to your normal diet. Heavy or fried foods are harder to digest and may make you feel nauseous or bloated.  Likewise, meals heavy in dairy and vegetables can increase bloating.  Drink plenty of fluids but you should avoid alcoholic beverages for 24 hours.  ACTIVITY:  You should plan to take it easy for the rest of today and you should NOT DRIVE or use heavy machinery until tomorrow (because of the sedation medicines used during the test).    FOLLOW UP: Our staff will call the number listed on your records the next business day following your procedure to check on you and address any questions or concerns that you may have regarding the information given to you following your procedure. If we do not reach you, we will leave a message.  However, if you are feeling well and you are not experiencing any problems, there is no need to return our call.  We will assume that you have returned to your regular daily activities without incident.  If any biopsies were taken you will be contacted by phone or by letter within the next 1-3 weeks.  Please call us at [phone] if you have not heard about the biopsies in 3 weeks.    SIGNATURES/CONFIDENTIALITY: You and/or your care partner have signed paperwork  which will be entered into your electronic medical record.  These signatures attest to the fact that that the information above on your After Visit Summary has been reviewed and is understood.  Full responsibility of the confidentiality of this discharge information lies with you and/or your care-partner.

## 2014-12-27 NOTE — Progress Notes (Signed)
Called to room to assist during endoscopic procedure.  Patient ID and intended procedure confirmed with present staff. Received instructions for my participation in the procedure from the performing physician.  

## 2014-12-27 NOTE — Progress Notes (Signed)
Pt awake and alert, pleased with MAC, Report to RN

## 2014-12-27 NOTE — Op Note (Signed)
[place] [place]  [address]. [place], [id number]   COLONOSCOPY PROCEDURE REPORT  PATIENT: Meagan Schwartz, Meagan Schwartz  MR#: [id number] BIRTHDATE: 16-Jul-1946 , 47  yrs. old GENDER: female ENDOSCOPIST: Gatha Mayer, MD, [facility] PROCEDURE DATE:  12/27/2014 PROCEDURE:   Colonoscopy with biopsy and Colonoscopy, screening First Screening Colonoscopy - Avg.  risk and is 50 yrs.  old or older - No.  Prior Negative Screening - Now for repeat screening. N/A  History of Adenoma - Now for follow-up colonoscopy & has been > or = to 3 yrs.  Yes hx of adenoma.  Has been 3 or more years since last colonoscopy. ASA CLASS:   Class I INDICATIONS:Surveillance due to prior colonic neoplasia and PH Colon Adenoma. MEDICATIONS: Propofol 300 mg IV and Monitored anesthesia care  DESCRIPTION OF PROCEDURE:   After the risks benefits and alternatives of the procedure were thoroughly explained, informed consent was obtained.  The digital rectal exam revealed no abnormalities of the rectum.   The LB [id number]  endoscope was introduced through the anus and advanced to the cecum, which was identified by both the appendix and ileocecal valve. No adverse events experienced.   The quality of the prep was excellent. (MiraLax was used)  The instrument was then slowly withdrawn as the colon was fully examined.      COLON FINDINGS: Two sessile polyps ranging from 2 to 25mm in size were found in the ascending colon.  A polypectomy was performed with cold forceps.  The resection was complete, the polyp tissue was completely retrieved and sent to histology.   There was moderate diverticulosis noted in the sigmoid colon.   The examination was otherwise normal.  Retroflexed views revealed no abnormalities. The time to cecum = 4.2 Withdrawal time = 11.8   The scope was withdrawn and the procedure completed. COMPLICATIONS: There were no immediate complications.  ENDOSCOPIC IMPRESSION: Two sessile polyps  ranging from 2 to 53mm in size were found in the ascending colon and removed Sigmoid divertculosis Otherwise normal - excellent prep  RECOMMENDATIONS: Timing of repeat colonoscopy will be determined by pathology findings.  eSigned:  Gatha Mayer, MD, [facility] 12/27/2014 10:23 AM   cc: The Patient and Hulan Fess, MD

## 2014-12-28 ENCOUNTER — Telehealth: Payer: Self-pay

## 2014-12-28 NOTE — Telephone Encounter (Signed)
  Follow up Call-  Call back number 12/27/2014  Post procedure Call Back phone  # [phone]  Permission to leave phone message Yes     Patient questions:  Do you have a fever, pain , or abdominal swelling? No. Pain Score  0 *  Have you tolerated food without any problems? Yes.    Have you been able to return to your normal activities? Yes.    Do you have any questions about your discharge instructions: Diet   No. Medications  No. Follow up visit  No.  Do you have questions or concerns about your Care? No.  Actions: * If pain score is 4 or above: No action needed, pain <4.

## 2014-12-30 ENCOUNTER — Encounter: Payer: Self-pay | Admitting: Internal Medicine

## 2014-12-30 DIAGNOSIS — Z86010 Personal history of colonic polyps: Secondary | ICD-10-CM

## 2014-12-30 NOTE — Progress Notes (Signed)
Quick Note:  2 diminutive adenomas - repeat colonoscopy 2021 ______

## 2015-01-20 DIAGNOSIS — L219 Seborrheic dermatitis, unspecified: Secondary | ICD-10-CM | POA: Diagnosis not present

## 2015-02-04 DIAGNOSIS — Z79899 Other long term (current) drug therapy: Secondary | ICD-10-CM | POA: Diagnosis not present

## 2015-02-04 DIAGNOSIS — Z13820 Encounter for screening for osteoporosis: Secondary | ICD-10-CM | POA: Diagnosis not present

## 2015-02-04 DIAGNOSIS — E559 Vitamin D deficiency, unspecified: Secondary | ICD-10-CM | POA: Diagnosis not present

## 2015-02-04 DIAGNOSIS — H6123 Impacted cerumen, bilateral: Secondary | ICD-10-CM | POA: Diagnosis not present

## 2015-02-04 DIAGNOSIS — E785 Hyperlipidemia, unspecified: Secondary | ICD-10-CM | POA: Diagnosis not present

## 2015-02-04 DIAGNOSIS — Z0000 Encounter for general adult medical examination without abnormal findings: Secondary | ICD-10-CM | POA: Diagnosis not present

## 2015-02-04 DIAGNOSIS — K635 Polyp of colon: Secondary | ICD-10-CM | POA: Diagnosis not present

## 2015-02-04 DIAGNOSIS — E039 Hypothyroidism, unspecified: Secondary | ICD-10-CM | POA: Diagnosis not present

## 2015-06-17 DIAGNOSIS — S80862A Insect bite (nonvenomous), left lower leg, initial encounter: Secondary | ICD-10-CM | POA: Diagnosis not present

## 2015-06-17 DIAGNOSIS — W57XXXA Bitten or stung by nonvenomous insect and other nonvenomous arthropods, initial encounter: Secondary | ICD-10-CM | POA: Diagnosis not present

## 2015-06-17 DIAGNOSIS — L239 Allergic contact dermatitis, unspecified cause: Secondary | ICD-10-CM | POA: Diagnosis not present

## 2015-06-21 DIAGNOSIS — E2839 Other primary ovarian failure: Secondary | ICD-10-CM | POA: Diagnosis not present

## 2015-06-23 ENCOUNTER — Other Ambulatory Visit: Payer: Self-pay

## 2015-06-23 DIAGNOSIS — Z1231 Encounter for screening mammogram for malignant neoplasm of breast: Secondary | ICD-10-CM

## 2015-06-24 ENCOUNTER — Telehealth: Payer: Self-pay | Admitting: Nurse Practitioner

## 2015-06-24 ENCOUNTER — Encounter: Payer: Self-pay | Admitting: Nurse Practitioner

## 2015-06-24 ENCOUNTER — Other Ambulatory Visit: Payer: Self-pay | Admitting: Nurse Practitioner

## 2015-06-24 ENCOUNTER — Other Ambulatory Visit: Payer: Self-pay

## 2015-06-24 ENCOUNTER — Ambulatory Visit (INDEPENDENT_AMBULATORY_CARE_PROVIDER_SITE_OTHER): Payer: Medicare Other | Admitting: Nurse Practitioner

## 2015-06-24 VITALS — BP 120/82 | HR 60 | Ht 62.25 in | Wt 139.0 lb

## 2015-06-24 DIAGNOSIS — N6325 Unspecified lump in the left breast, overlapping quadrants: Secondary | ICD-10-CM

## 2015-06-24 DIAGNOSIS — Z124 Encounter for screening for malignant neoplasm of cervix: Secondary | ICD-10-CM | POA: Diagnosis not present

## 2015-06-24 DIAGNOSIS — N63 Unspecified lump in breast: Secondary | ICD-10-CM

## 2015-06-24 DIAGNOSIS — Z01419 Encounter for gynecological examination (general) (routine) without abnormal findings: Secondary | ICD-10-CM

## 2015-06-24 DIAGNOSIS — N6320 Unspecified lump in the left breast, unspecified quadrant: Secondary | ICD-10-CM

## 2015-06-24 DIAGNOSIS — N952 Postmenopausal atrophic vaginitis: Secondary | ICD-10-CM

## 2015-06-24 DIAGNOSIS — Z0000 Encounter for general adult medical examination without abnormal findings: Secondary | ICD-10-CM

## 2015-06-24 MED ORDER — ESTRADIOL 0.1 MG/GM VA CREA: TOPICAL_CREAM | VAGINAL | Status: DC

## 2015-06-24 NOTE — Telephone Encounter (Signed)
Patient forgot to ask for a prescription for Estrace cream today when she came in to see Patty. [facility] on [address]

## 2015-06-24 NOTE — Progress Notes (Signed)
Patient ID: Meagan Schwartz, female   DOB: [birth year], 69 y.o.   MRN: [id number] 69 y.o. G26P2002 Married  Caucasian Fe here for annual exam.  She goes to [place] in the summer and takes the grandchildren who live in [place].  She is mainly concerned about her memory.  12/2014 had colonoscopy with 2 adenomas and repeat in 5 years.  Patient's last menstrual period was 09/17/1996 (approximate).          Sexually active: No.  The current method of family planning is post menopausal status.    Exercising: Yes.    walking and stationary bike Smoker:  no  Health Maintenance: Pap:06/08/14, Negative  MMG: 07/21/14, Bilateral Diagnostic, Bi-Rads 2:  benign findings on right; screening in one year Colonoscopy:12/27/14, adenoma x 2, repeat in 5 years BMD: 06/21/15, [facility], no results at time of visit TDaP: 08/2009 Shingles ? 2014 / 2015 Labs: PCP   reports that she has never smoked. She has never used smokeless tobacco. She reports that she drinks about 4.2 oz of alcohol per week. She reports that she does not use illicit drugs.  Past Medical History  Diagnosis Date  . Hyperlipidemia   . 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 Vitamin D deficiency   . Arthritis   . Hx of colonic polyps 12/27/2014    Past Surgical History  Procedure Laterality Date  . Vaginal delivery      x2  . Colonoscopy  10-04-2009    Current Outpatient Prescriptions  Medication Sig Dispense Refill  . Calcium Carbonate-Vit D-Min (CALCIUM 1200) 1200-1000 MG-UNIT CHEW Chew 1 capsule by mouth daily.    . Cholecalciferol (VITAMIN D-3) 1000 UNITS CAPS Take 1,000 Units by mouth daily.    . Coenzyme Q10 (CO Q 10) 100 MG CAPS Take 100 mg by mouth daily.    Marland Kitchen estradiol (ESTRACE) 0.1 MG/GM vaginal cream Use 1/2 g vaginally twice weekly 42.5 g 3  . levothyroxine (SYNTHROID, LEVOTHROID) 75 MCG tablet Take 1 tablet by mouth daily.    . Multiple Vitamin (MULTIVITAMIN) tablet Take 1 tablet by mouth daily.    .  multivitamin-lutein (OCUVITE-LUTEIN) CAPS capsule Take 1 capsule by mouth daily.    . Omega-3 Fatty Acids (FISH OIL) 1000 MG CAPS Take 1,000 mg by mouth daily.    . Red Yeast Rice 600 MG CAPS Take 600 mg by mouth daily.    . vitamin B-12 (CYANOCOBALAMIN) 500 MCG tablet Take 500 mcg by mouth daily.    . vitamin E 400 UNIT capsule Take 400 Units by mouth daily.     No current facility-administered medications for this visit.    Family History  Problem Relation Age of Onset  . Colon cancer Mother   . Heart disease Mother   . Alzheimer's disease Father   . Heart disease Father   . Leukemia Brother   . Breast cancer Maternal Grandmother   . Breast cancer Paternal Grandmother   . Paget's disease of bone Cousin   . Paget's disease of bone Cousin   . Rectal cancer Neg Hx   . Stomach cancer Neg Hx   . Polycythemia Brother   . Graves' disease Daughter   . Graves' disease Son     ROS:  Pertinent items are noted in HPI.  Otherwise, a comprehensive ROS was negative.  Exam:   BP 120/82 mmHg  Pulse 60  Ht 5' 2.25" (1.581 m)  Wt 139 lb (63.05 kg)  BMI 25.22 kg/m2  LMP 09/17/1996 (Approximate) Height: 5' 2.25" (158.1 cm) Ht Readings from Last 3 Encounters:  06/24/15 5' 2.25" (1.581 m)  12/27/14 5\' 4"  (1.626 m)  12/13/14 5\' 4"  (1.626 m)    General appearance: alert, cooperative and appears stated age Head: Normocephalic, without obvious abnormality, atraumatic Neck: no adenopathy, supple, symmetrical, trachea midline and thyroid normal to inspection and palpation Lungs: clear to auscultation bilaterally Breasts: normal appearance, no masses or tenderness, positive findings: 2 cm nodule located on the left 12:00 o'clock and is smooth non tender and mobile.  No other breast mass or nodes. Heart: regular rate and rhythm Abdomen: soft, non-tender; no masses,  no organomegaly Extremities: extremities normal, atraumatic, no cyanosis or edema Skin: Skin color, texture, turgor normal. No  rashes or lesions Lymph nodes: Cervical, supraclavicular, and axillary nodes normal. No abnormal inguinal nodes palpated Neurologic: Grossly normal   Pelvic: External genitalia:  no lesions              Urethra:  normal appearing urethra with no masses, tenderness or lesions              Bartholin's and Skene's: normal                 Vagina: normal appearing vagina with normal color and discharge, no lesions              Cervix: anteverted              Pap taken: No. Bimanual Exam:  Uterus:  normal size, contour, position, consistency, mobility, non-tender              Adnexa: no mass, fullness, tenderness               Rectovaginal: Confirms               Anus:  normal sphincter tone, no lesions  Chaperone present: no  A:  Well Woman with normal exam  Postmenopausal no HRT History of hypothyroid PCP is following Atrophic vaginitis  Left Breast mass - most likely FCB changes   P:   Reviewed health and wellness pertinent to exam  Pap smear as above  Mammogram screening is scheduled for 07/25/15 - will change order to diagnostic secondary to concerns about left breast mass.  Refill Estrace vaginal cream for a year - may need to DC if any positive finding on Mammogram - for the interim she will stop the cream  Counseled with risk of DVT, CVA, cancer, etc.  Counseled on breast self exam, mammography screening, use and side effects of HRT, adequate intake of calcium and vitamin D, diet and exercise return annually or prn  An After Visit Summary was printed and given to the patient.

## 2015-06-24 NOTE — Telephone Encounter (Signed)
Noted messages.  Routing to provider for final review. Patient agreeable to disposition. Will close encounter.

## 2015-06-24 NOTE — Telephone Encounter (Signed)
Patient called back and she said she forgot that Ms. Patty said she would send her prescription electronically. She said that she will go to [facility] and pick it up.

## 2015-06-24 NOTE — Patient Instructions (Addendum)
EXERCISE AND DIET:  We recommended that you start or continue a regular exercise program for good health. Regular exercise means any activity that makes your heart beat faster and makes you sweat.  We recommend exercising at least 30 minutes per day at least 3 days a week, preferably 4 or 5.  We also recommend a diet low in fat and sugar.  Inactivity, poor dietary choices and obesity can cause diabetes, heart attack, stroke, and kidney damage, among others.    ALCOHOL AND SMOKING:  Women should limit their alcohol intake to no more than 7 drinks/beers/glasses of wine (combined, not each!) per week. Moderation of alcohol intake to this level decreases your risk of breast cancer and liver damage. And of course, no recreational drugs are part of a healthy lifestyle.  And absolutely no smoking or even second hand smoke. Most people know smoking can cause heart and lung diseases, but did you know it also contributes to weakening of your bones? Aging of your skin?  Yellowing of your teeth and nails?  CALCIUM AND VITAMIN D:  Adequate intake of calcium and Vitamin D are recommended.  The recommendations for exact amounts of these supplements seem to change often, but generally speaking 600 mg of calcium (either carbonate or citrate) and 800 units of Vitamin D per day seems prudent. Certain women may benefit from higher intake of Vitamin D.  If you are among these women, your doctor will have told you during your visit.    PAP SMEARS:  Pap smears, to check for cervical cancer or precancers,  have traditionally been done yearly, although recent scientific advances have shown that most women can have pap smears less often.  However, every woman still should have a physical exam from her gynecologist every year. It will include a breast check, inspection of the vulva and vagina to check for abnormal growths or skin changes, a visual exam of the cervix, and then an exam to evaluate the size and shape of the uterus and  ovaries.  And after 68 years of age, a rectal exam is indicated to check for rectal cancers. We will also provide age appropriate advice regarding health maintenance, like when you should have certain vaccines, screening for sexually transmitted diseases, bone density testing, colonoscopy, mammograms, etc.   MAMMOGRAMS:  All women over 68 years old should have a yearly mammogram. Many facilities now offer a "3D" mammogram, which may cost around $50 extra out of pocket. If possible,  we recommend you accept the option to have the 3D mammogram performed.  It both reduces the number of women who will be called back for extra views which then turn out to be normal, and it is better than the routine mammogram at detecting truly abnormal areas.    COLONOSCOPY:  Colonoscopy to screen for colon cancer is recommended for all women at age 50.  We know, you hate the idea of the prep.  We agree, BUT, having colon cancer and not knowing it is worse!!  Colon cancer so often starts as a polyp that can be seen and removed at colonscopy, which can quite literally save your life!  And if your first colonoscopy is normal and you have no family history of colon cancer, most women don't have to have it again for 10 years.  Once every ten years, you can do something that may end up saving your life, right?  We will be happy to help you get it scheduled when you are ready.    Be sure to check your insurance coverage so you understand how much it will cost.  It may be covered as a preventative service at no cost, but you should check your particular policy.    Ask Dr. Chalmers Cater about need for thyroid ultrasound ? Ask Dr. Rex Kras about bone density and need to increase Vit D or leave the same. Ask about date of Shingles vaccine Ask about Prevnar 13 - pneumonia shot  DO NOT use Estrogen cream until the Mammogram is done and reported as normal

## 2015-06-24 NOTE — Progress Notes (Signed)
Patient is scheduled for Bilateral Breast Diagnostic Mammogram and L Breast Ultrasound at [facility] on 07/25/15 at 1220 . Patient agreeable to time/date/location. Patient declines earlier appointment because this date works best for her and needs screening as well. Okay per Kem Boroughs, FNP.

## 2015-06-25 NOTE — Progress Notes (Signed)
Encounter reviewed by Dr. Brook Amundson C. Silva.  

## 2015-06-27 ENCOUNTER — Telehealth: Payer: Self-pay | Admitting: Nurse Practitioner

## 2015-06-27 NOTE — Telephone Encounter (Signed)
Correction:  On 10/7 she was changed from screening to diagnostic because of breast mass on the left.  She did not want to change her earlier apt date.  Olivia Mackie made that change for her.

## 2015-06-27 NOTE — Telephone Encounter (Signed)
Per report from the [facility] they say bilateral screening in 1 year.

## 2015-06-27 NOTE — Telephone Encounter (Signed)
Do you want the diagnostic to be bilateral or just of left?

## 2015-06-27 NOTE — Telephone Encounter (Signed)
Message left to return call to Meagan Schwartz at [phone].   Patient due for screening but not until after 07/25/2015. Patient did not want to go now for diagnostic imaging of one breast and then go back for screening in November of both breasts.  She is scheduled for bilateral 3D diagnostic mammogram and L Breast ultrasound for 07/25/15.

## 2015-06-27 NOTE — Telephone Encounter (Signed)
Patient was seen 06/24/15 and was scheduled for follow at [facility]. Patient has a question for the triage nurse.

## 2015-06-27 NOTE — Telephone Encounter (Signed)
Diagnostic on the left is OK and routine on the right side.

## 2015-06-27 NOTE — Telephone Encounter (Signed)
Return call to patient. She needed to reschedule mammogram appointment and contacted [facility] herself to do this. She thought she was to have bilateral diagnostic mammogram but they only have left diagnostic ordered.  Please confirm for patient which you recommend.

## 2015-07-05 NOTE — Telephone Encounter (Signed)
Patient called the breast center and changed appointment on 07/01/2015 11:54 AM. Notes per [facility]:  10/14 PT REQ TO HAVE EARLIER EXAM, SHE IS AWARE THE LT SIDE WILL BE DONE ONLY AND SHE WILL STILL BE DUE FOR ANNUAL IN NOV.   Continued Mammogram hold.  Will close encounter.

## 2015-07-08 ENCOUNTER — Other Ambulatory Visit: Payer: Self-pay | Admitting: Nurse Practitioner

## 2015-07-08 ENCOUNTER — Ambulatory Visit
Admission: RE | Admit: 2015-07-08 | Discharge: 2015-07-08 | Disposition: A | Discharge status: Home / Self Care | Payer: Medicare Other | Source: Ambulatory Visit | Attending: Nurse Practitioner | Admitting: Nurse Practitioner

## 2015-07-08 DIAGNOSIS — N6320 Unspecified lump in the left breast, unspecified quadrant: Secondary | ICD-10-CM

## 2015-07-08 DIAGNOSIS — N63 Unspecified lump in breast: Secondary | ICD-10-CM | POA: Diagnosis not present

## 2015-07-11 ENCOUNTER — Telehealth: Payer: Self-pay | Admitting: Nurse Practitioner

## 2015-07-11 NOTE — Telephone Encounter (Signed)
Patient returning Tracy's call. °

## 2015-07-11 NOTE — Telephone Encounter (Signed)
Patient would like to have a map and the "to do list" for the breast center of her left breast. She would like to pick this up if possible since her appointment is tomorrow.

## 2015-07-11 NOTE — Telephone Encounter (Signed)
Called patient. She will pick up copy of Patty's drawing of area of breast for her appointment tomorrow. Left at front desk. Patient declines that I fax to [facility] she left her prior order there at the breast center and wants to have a copy for her to bring with her.   Routing to provider for final review. Patient agreeable to disposition. Will close encounter.

## 2015-07-12 ENCOUNTER — Ambulatory Visit
Admission: RE | Admit: 2015-07-12 | Discharge: 2015-07-12 | Disposition: A | Discharge status: Home / Self Care | Payer: Medicare Other | Source: Ambulatory Visit | Attending: Nurse Practitioner | Admitting: Nurse Practitioner

## 2015-07-12 DIAGNOSIS — N6320 Unspecified lump in the left breast, unspecified quadrant: Secondary | ICD-10-CM

## 2015-07-12 DIAGNOSIS — D242 Benign neoplasm of left breast: Secondary | ICD-10-CM | POA: Diagnosis not present

## 2015-07-12 DIAGNOSIS — N63 Unspecified lump in breast: Secondary | ICD-10-CM | POA: Diagnosis not present

## 2015-07-20 DIAGNOSIS — Z23 Encounter for immunization: Secondary | ICD-10-CM | POA: Diagnosis not present

## 2015-07-25 ENCOUNTER — Ambulatory Visit: Payer: Medicare Other

## 2015-07-25 ENCOUNTER — Other Ambulatory Visit: Payer: Self-pay | Admitting: Nurse Practitioner

## 2015-07-25 ENCOUNTER — Other Ambulatory Visit: Payer: Medicare Other

## 2015-07-25 DIAGNOSIS — D249 Benign neoplasm of unspecified breast: Secondary | ICD-10-CM

## 2015-08-04 ENCOUNTER — Ambulatory Visit: Payer: Medicare Other

## 2015-08-16 ENCOUNTER — Other Ambulatory Visit: Payer: Self-pay

## 2015-08-16 ENCOUNTER — Other Ambulatory Visit: Payer: Self-pay | Admitting: Family Medicine

## 2015-08-16 DIAGNOSIS — D242 Benign neoplasm of left breast: Secondary | ICD-10-CM

## 2015-08-16 DIAGNOSIS — N6320 Unspecified lump in the left breast, unspecified quadrant: Secondary | ICD-10-CM

## 2015-08-18 ENCOUNTER — Other Ambulatory Visit: Payer: Self-pay | Admitting: Nurse Practitioner

## 2015-08-18 ENCOUNTER — Ambulatory Visit: Payer: Medicare Other

## 2015-08-18 ENCOUNTER — Ambulatory Visit
Admission: RE | Admit: 2015-08-18 | Discharge: 2015-08-18 | Disposition: A | Discharge status: Home / Self Care | Payer: Medicare Other | Source: Ambulatory Visit

## 2015-08-18 DIAGNOSIS — N6320 Unspecified lump in the left breast, unspecified quadrant: Secondary | ICD-10-CM

## 2015-08-18 DIAGNOSIS — D242 Benign neoplasm of left breast: Secondary | ICD-10-CM

## 2015-08-18 DIAGNOSIS — R922 Inconclusive mammogram: Secondary | ICD-10-CM | POA: Diagnosis not present

## 2015-09-29 DIAGNOSIS — H2513 Age-related nuclear cataract, bilateral: Secondary | ICD-10-CM | POA: Diagnosis not present

## 2015-09-29 DIAGNOSIS — H25013 Cortical age-related cataract, bilateral: Secondary | ICD-10-CM | POA: Diagnosis not present

## 2015-10-21 DIAGNOSIS — E039 Hypothyroidism, unspecified: Secondary | ICD-10-CM | POA: Diagnosis not present

## 2015-10-28 DIAGNOSIS — E039 Hypothyroidism, unspecified: Secondary | ICD-10-CM | POA: Diagnosis not present

## 2015-12-27 ENCOUNTER — Other Ambulatory Visit: Payer: Self-pay

## 2015-12-27 DIAGNOSIS — Z1231 Encounter for screening mammogram for malignant neoplasm of breast: Secondary | ICD-10-CM

## 2016-01-23 ENCOUNTER — Other Ambulatory Visit: Payer: Self-pay

## 2016-01-23 DIAGNOSIS — Z1231 Encounter for screening mammogram for malignant neoplasm of breast: Secondary | ICD-10-CM

## 2016-02-09 DIAGNOSIS — E785 Hyperlipidemia, unspecified: Secondary | ICD-10-CM | POA: Diagnosis not present

## 2016-02-09 DIAGNOSIS — E559 Vitamin D deficiency, unspecified: Secondary | ICD-10-CM | POA: Diagnosis not present

## 2016-02-09 DIAGNOSIS — Z79899 Other long term (current) drug therapy: Secondary | ICD-10-CM | POA: Diagnosis not present

## 2016-02-09 DIAGNOSIS — Z86010 Personal history of colonic polyps: Secondary | ICD-10-CM | POA: Diagnosis not present

## 2016-02-09 DIAGNOSIS — E039 Hypothyroidism, unspecified: Secondary | ICD-10-CM | POA: Diagnosis not present

## 2016-02-09 DIAGNOSIS — R8290 Unspecified abnormal findings in urine: Secondary | ICD-10-CM | POA: Diagnosis not present

## 2016-02-09 DIAGNOSIS — J309 Allergic rhinitis, unspecified: Secondary | ICD-10-CM | POA: Diagnosis not present

## 2016-02-09 DIAGNOSIS — Z0000 Encounter for general adult medical examination without abnormal findings: Secondary | ICD-10-CM | POA: Diagnosis not present

## 2016-06-25 ENCOUNTER — Encounter: Payer: Self-pay | Admitting: Nurse Practitioner

## 2016-06-25 ENCOUNTER — Ambulatory Visit (INDEPENDENT_AMBULATORY_CARE_PROVIDER_SITE_OTHER): Payer: Medicare Other | Admitting: Nurse Practitioner

## 2016-06-25 VITALS — BP 136/78 | HR 60 | Ht 62.25 in | Wt 129.0 lb

## 2016-06-25 DIAGNOSIS — Z124 Encounter for screening for malignant neoplasm of cervix: Secondary | ICD-10-CM

## 2016-06-25 DIAGNOSIS — N952 Postmenopausal atrophic vaginitis: Secondary | ICD-10-CM | POA: Diagnosis not present

## 2016-06-25 DIAGNOSIS — Z01419 Encounter for gynecological examination (general) (routine) without abnormal findings: Secondary | ICD-10-CM

## 2016-06-25 DIAGNOSIS — E039 Hypothyroidism, unspecified: Secondary | ICD-10-CM

## 2016-06-25 DIAGNOSIS — Z0000 Encounter for general adult medical examination without abnormal findings: Secondary | ICD-10-CM

## 2016-06-25 MED ORDER — ESTRADIOL 0.1 MG/GM VA CREA: TOPICAL_CREAM | VAGINAL | 3 refills | Status: DC

## 2016-06-25 NOTE — Progress Notes (Signed)
Patient ID: Meagan Schwartz, female   DOB: 1946/07/13, 70 y.o.   MRN: [id number]  70 y.o. G61P2002 Married  Caucasian Fe here for annual exam.  Still having some memory issues.   She is concerned but not fearful or think it is that bad yet.  Maybe a little worse than last year.  Will be seeing PCP next month and will discuss.  They did go back to [place] with grandchildren this year.  After they left and went back to [place] they then went to [facility].  Patient's last menstrual period was 09/17/1996 (approximate).          Sexually active: No.  The current method of family planning is post menopausal status.    Exercising: Yes.    walking 2 times a day with dogs and stationary bike almost everyday. Smoker:  no  Health Maintenance: Pap:06/08/14, Negative  MMG: 08/18/15, 3D, Bilateral Diagnostic, Bi-Rads 2:  benign findings on right; screening in one year Colonoscopy:12/27/14, adenoma x 2, repeat in 5 years BMD: 06/21/15, [facility], followed by PCP.  Previous 09/2009 was 0.7/0.0 TDaP: 08/2009 Shingles ? 2014/2015 Pneumonia: unsure Hep C and HIV: discuss today and will check with PCP Labs: PCP takes care of all labs   reports that she has never smoked. She has never used smokeless tobacco. She reports that she drinks about 4.2 oz of alcohol per week . She reports that she does not use drugs.  Past Medical History:  Diagnosis Date  . Arthritis   . Hx of colonic polyps 12/27/2014  . Hyperlipidemia   . 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 Vitamin D deficiency     Past Surgical History:  Procedure Laterality Date  . COLONOSCOPY  10-04-2009  . VAGINAL DELIVERY     x2    Current Outpatient Prescriptions  Medication Sig Dispense Refill  . Calcium Carbonate-Vit D-Min (CALCIUM 1200) 1200-1000 MG-UNIT CHEW Chew 1 capsule by mouth daily.    . Cholecalciferol (VITAMIN D-3) 1000 UNITS CAPS Take 1,000 Units by mouth daily.    . Coenzyme Q10 (CO Q 10) 100 MG CAPS Take 100 mg by mouth  daily.    Marland Kitchen estradiol (ESTRACE) 0.1 MG/GM vaginal cream Use 1/2 g vaginally twice weekly 42.5 g 3  . levothyroxine (SYNTHROID, LEVOTHROID) 75 MCG tablet Take 1 tablet by mouth daily.    . Multiple Vitamin (MULTIVITAMIN) tablet Take 1 tablet by mouth daily.    . multivitamin-lutein (OCUVITE-LUTEIN) CAPS capsule Take 1 capsule by mouth daily.    . Omega-3 Fatty Acids (FISH OIL) 1000 MG CAPS Take 1,000 mg by mouth daily.    . Red Yeast Rice 600 MG CAPS Take 600 mg by mouth daily.    . vitamin B-12 (CYANOCOBALAMIN) 500 MCG tablet Take 500 mcg by mouth daily.    . vitamin E 400 UNIT capsule Take 400 Units by mouth daily.     No current facility-administered medications for this visit.     Family History  Problem Relation Age of Onset  . Colon cancer Mother   . Heart disease Mother   . Alzheimer's disease Father   . Heart disease Father   . Leukemia Brother   . Breast cancer Maternal Grandmother   . Breast cancer Paternal Grandmother   . Paget's disease of bone Cousin   . Paget's disease of bone Cousin   . Rectal cancer Neg Hx   . Stomach cancer Neg Hx   . Polycythemia Brother   .  Graves' disease Daughter   . Graves' disease Son     ROS:  Pertinent items are noted in HPI.  Otherwise, a comprehensive ROS was negative.  Exam:   LMP 09/17/1996 (Approximate)    Ht Readings from Last 3 Encounters:  06/24/15 5' 2.25" (1.581 m)  12/27/14 5\' 4"  (1.626 m)  12/13/14 5\' 4"  (1.626 m)    General appearance: alert, cooperative and appears stated age Head: Normocephalic, without obvious abnormality, atraumatic Neck: no adenopathy, supple, symmetrical, trachea midline and thyroid normal to inspection and palpation Lungs: clear to auscultation bilaterally Breasts: normal appearance, no masses or tenderness Heart: regular rate and rhythm Abdomen: soft, non-tender; no masses,  no organomegaly Extremities: extremities normal, atraumatic, no cyanosis or edema Skin: Skin color, texture, turgor  normal. No rashes or lesions Lymph nodes: Cervical, supraclavicular, and axillary nodes normal. No abnormal inguinal nodes palpated Neurologic: Grossly normal   Pelvic: External genitalia:  no lesions              Urethra:  normal appearing urethra with no masses, tenderness or lesions              Bartholin's and Skene's: normal                 Vagina: normal appearing vagina with normal color and discharge, no lesions              Cervix: anteverted              Pap taken: No. 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  Counseled with risk of DVT, CV, cancer, etc  Counseled on breast self exam, mammography screening, use and side effects of HRT, adequate intake of calcium and vitamin D, diet and exercise return annually or prn  An After Visit Summary was printed and given to the patient.

## 2016-06-25 NOTE — Patient Instructions (Addendum)

## 2016-06-26 NOTE — Progress Notes (Signed)
Encounter reviewed by Dr. Dreamer Carillo Amundson C. Silva.  

## 2016-07-04 ENCOUNTER — Telehealth: Payer: Self-pay

## 2016-07-04 NOTE — Telephone Encounter (Signed)
Left message to call [place] at [phone].  Need to advise patient PA for Estrace cream has been denied by her insurance company. Reviewed this with Kem Boroughs, FNP and the patient may switch to Premarin cream which is a covered medication per the patient's insurance company.

## 2016-07-05 NOTE — Telephone Encounter (Signed)
Spoke with patient. Advised patient PA for Estrace cream has been denied by her insurance company. Advised Premarin is on her formulary and per review with Kem Boroughs, FNP this is a good alternative. Patient states she had her Estrace filled recently and has plenty at this time. She will contact the office when she is close to needing a refill if she would like to switch to using Premarin cream. Aware if she continues with Estrace cream this will be an OOP cost since it is not covered by her insurance plan. Patient verbalizes understanding.  Routing to provider for final review. Patient agreeable to disposition. Will close encounter.

## 2016-07-25 DIAGNOSIS — Z23 Encounter for immunization: Secondary | ICD-10-CM | POA: Diagnosis not present

## 2016-08-20 ENCOUNTER — Ambulatory Visit
Admission: RE | Admit: 2016-08-20 | Discharge: 2016-08-20 | Disposition: A | Discharge status: Home / Self Care | Payer: Medicare Other | Source: Ambulatory Visit

## 2016-08-20 DIAGNOSIS — Z1231 Encounter for screening mammogram for malignant neoplasm of breast: Secondary | ICD-10-CM

## 2016-08-21 ENCOUNTER — Other Ambulatory Visit: Payer: Self-pay | Admitting: Nurse Practitioner

## 2016-08-21 DIAGNOSIS — Z1231 Encounter for screening mammogram for malignant neoplasm of breast: Secondary | ICD-10-CM

## 2016-10-23 ENCOUNTER — Ambulatory Visit
Admission: RE | Admit: 2016-10-23 | Discharge: 2016-10-23 | Disposition: A | Discharge status: Home / Self Care | Payer: Medicare Other | Source: Ambulatory Visit | Attending: Nurse Practitioner | Admitting: Nurse Practitioner

## 2016-10-23 DIAGNOSIS — Z1231 Encounter for screening mammogram for malignant neoplasm of breast: Secondary | ICD-10-CM | POA: Diagnosis not present

## 2017-02-13 DIAGNOSIS — E559 Vitamin D deficiency, unspecified: Secondary | ICD-10-CM | POA: Diagnosis not present

## 2017-02-13 DIAGNOSIS — N289 Disorder of kidney and ureter, unspecified: Secondary | ICD-10-CM | POA: Diagnosis not present

## 2017-02-13 DIAGNOSIS — E785 Hyperlipidemia, unspecified: Secondary | ICD-10-CM | POA: Diagnosis not present

## 2017-02-13 DIAGNOSIS — R8290 Unspecified abnormal findings in urine: Secondary | ICD-10-CM | POA: Diagnosis not present

## 2017-02-13 DIAGNOSIS — E039 Hypothyroidism, unspecified: Secondary | ICD-10-CM | POA: Diagnosis not present

## 2017-02-13 DIAGNOSIS — R413 Other amnesia: Secondary | ICD-10-CM | POA: Diagnosis not present

## 2017-02-13 DIAGNOSIS — Z79899 Other long term (current) drug therapy: Secondary | ICD-10-CM | POA: Diagnosis not present

## 2017-02-13 DIAGNOSIS — Z86010 Personal history of colonic polyps: Secondary | ICD-10-CM | POA: Diagnosis not present

## 2017-02-13 DIAGNOSIS — Z0000 Encounter for general adult medical examination without abnormal findings: Secondary | ICD-10-CM | POA: Diagnosis not present

## 2017-02-13 DIAGNOSIS — L84 Corns and callosities: Secondary | ICD-10-CM | POA: Diagnosis not present

## 2017-02-20 ENCOUNTER — Ambulatory Visit: Admit: 2017-02-20 | Payer: MEDICARE | Attending: Neurology

## 2017-02-20 DIAGNOSIS — R4189 Other symptoms and signs involving cognitive functions and awareness: Secondary | ICD-10-CM | POA: Insufficient documentation

## 2017-02-20 DIAGNOSIS — R55 Syncope and collapse: Secondary | ICD-10-CM

## 2017-02-20 DIAGNOSIS — R42 Dizziness and giddiness: Secondary | ICD-10-CM | POA: Insufficient documentation

## 2017-02-20 NOTE — Unmapped (Signed)
You probably had presyncope - almost passed out due to not enough blood going to brain.  You may have been dehydrated after traveling and drank alcohol.   Unlikely seizure and not a stroke.  If you have repeat spell, will have to do more testing.  Right now, keep hydrated.    You have some memory problems.   You need to get a neurology appointment back in [place] after summer.  They will probably get a brain picture and start you on some medication for memory.  You have some history in the family of problems with memory.

## 2017-02-20 NOTE — Unmapped (Signed)
Subjective:      Patient ID: Katherine Richard is a 71 y.o. female.    HPI     She was standing.  She leaned back against wall and said that she was not feeling good.  She then crumpled and slid down wall to floor - sitting on floor.  No fall.  She remembers the spell but family was talking to her but she didn't respond while going down.  She responded she could hear when she was sitting on floor. She felt very hot.  Within a few minutes, she was OK.  No urinary incontinence or tongue biting.    Never passed out before.   No history of seizures.    No recent change in medications.    She had been driving all day but was relatively dehydrated and then had [employer] hard lemonade at dinner.    No history of cardiac arrhythmias.    Recent test for UTI negative and other blood tests normal.     Corn on R foot - affects walking.     BPs have been generally running 120-130s until today.     Occasional short term memory complaints per her admission.  She does have family history of memory problems in father and several relations who had Alzheimer's Disease.   The son-in-law noted that she wrote their names funny and transposed numbers on recent card she sent them.       Histories:     She has no past medical history on file.    She has no past surgical history on file.    Her family history is not on file.    She reports that she has never smoked. She has never used smokeless tobacco. She reports that she does not drink alcohol.      Review of Systems  Review of symptoms filled out on paper form and to be scanned into EPIC was reviewed.  Allergies:   Clindamycin    Medications:     Outpatient Encounter Prescriptions as of 02/20/2017   Medication Sig Dispense Refill   ??? cholecalciferol, vitamin D3, 1,000 unit capsule Take by mouth.     ??? coenzyme Q10 100 mg capsule Take by mouth.     ??? cyanocobalamin (VITAMIN B-12) 500 MCG tablet Take by mouth.     ??? estradiol (ESTRACE) 0.01 % (0.1 mg/gram) vaginal cream Use 1/2 g vaginally  twice weekly     ??? levothyroxine (SYNTHROID, LEVOTHROID) 75 MCG tablet Take by mouth.     ??? multivitamin (MULTIVITAMIN) tablet Take by mouth.     ??? omega-3 acid ethyl esters (LOVAZA) 1 gram capsule Take by mouth.     ??? red yeast rice 600 mg Cap Take by mouth.     ??? vit C,E-Zn-coppr-lutein-zeaxan (OCUVITE LUTEIN AND ZEAXANTHIN) 60 mg-30 unit- 15 mg-2 mg-6 mg Cap Take by mouth.     ??? vitamin E 400 UNIT capsule Take by mouth.       No facility-administered encounter medications on file as of 02/20/2017.                 Objective:       Blood pressure 156/90, height 5' 2 (1.575 m), weight 128 lb (58.1 kg).  145/80 on second time.    Neurologic Exam     Mental Status   Oriented to person, place, and time.   Attention: normal.   Speech: speech is normal   Level of consciousness: alert  Knowledge: good.  Able to name object. Able to repeat. Normal comprehension.   Normal orientation except for date.   She named OK but struggled with lapel initially.  Repeated OK but initially made one error with no, ifs, ands, or buts.  100-7=93-7=86-7=73.   0 of 4 objects at 3 minutes times two.     Cranial Nerves   Cranial nerves II through XII intact.     Motor Exam   Muscle bulk: normal  Overall muscle tone: normal    Strength   Right deltoid: 5/5  Left deltoid: 5/5  Right biceps: 5/5  Left biceps: 5/5  Right triceps: 5/5  Left triceps: 5/5  Right wrist extension: 5/5  Left wrist extension: 5/5  Right interossei: 5/5  Left interossei: 5/5  Right iliopsoas: 5/5  Left iliopsoas: 5/5  Right quadriceps: 5/5  Left quadriceps: 5/5  Right hamstring: 5/5  Left hamstring: 5/5  Right anterior tibial: 5/5  Left anterior tibial: 5/5  Right gastroc: 5/5  Left gastroc: 5/5    Sensory Exam   Light touch normal.   Right leg vibration: normal  Left leg vibration: normal  Normal screening exam     Gait, Coordination, and Reflexes     Gait  Gait: normal    Coordination   Romberg: negative  Finger to nose coordination: normal  Tandem walking coordination:  normal    Tremor   Resting tremor: absent  Intention tremor: absent  Action tremor: absent    Reflexes   Right brachioradialis: 2+  Left brachioradialis: 2+  Right biceps: 2+  Left biceps: 2+  Right triceps: 2+  Left triceps: 2+  Right patellar: 2+  Left patellar: 2+  Right achilles: 2+  Left achilles: 2+  Right grip: 2+  Left grip: 2+  Right plantar: normal  Left plantar: normalRAMS in fingers and feet are normal       Physical Exam   Nursing note and vitals reviewed.  Constitutional: She is oriented to person, place, and time. She appears well-developed and well-nourished.   Cardiovascular: Normal rate and regular rhythm.    Neurological: She is oriented to person, place, and time. She has a normal Finger-Nose-Finger Test, a normal Romberg Test and a normal Tandem Gait Test. Gait normal.   Reflex Scores:       Tricep reflexes are 2+ on the right side and 2+ on the left side.       Bicep reflexes are 2+ on the right side and 2+ on the left side.       Brachioradialis reflexes are 2+ on the right side and 2+ on the left side.       Patellar reflexes are 2+ on the right side and 2+ on the left side.       Achilles reflexes are 2+ on the right side and 2+ on the left side.  Skin: Skin is warm and dry.   Psychiatric: She has a normal mood and affect. Her speech is normal and behavior is normal. Judgment and thought content normal.       Prior Diagnostic Testing:      Assessment:     1) Likely had presyncopal episode.   Suspect dehydration and alcohol.  Doesn't sound like seizure and not a TIA/stroke.  Can't rule out arrhythmia.  No evidence of UTI on recent labs.  2) Cognitive deficit - mostly short-term memory but some subtle language issues and family history of dementia.   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 Quiet affect.  Plan:     1) Encourage hydration.  If recurrent spell, will need cardiac monitor and could get EEG depending upon presentation of new event.   2) Needs neurologic evaluation back home after summer with  neurologist in [place] with MRI of head and probably starting aricept.   They are on way to [place] and don't want to start new medication given recent spell.

## 2017-04-03 ENCOUNTER — Telehealth: Payer: Self-pay | Admitting: Obstetrics and Gynecology

## 2017-04-03 NOTE — Telephone Encounter (Signed)
Left message for patient to reschedule her [address] appointment

## 2017-06-25 ENCOUNTER — Ambulatory Visit: Payer: Medicare Other | Admitting: Nurse Practitioner

## 2017-06-26 DIAGNOSIS — Z23 Encounter for immunization: Secondary | ICD-10-CM | POA: Diagnosis not present

## 2017-06-27 ENCOUNTER — Encounter: Payer: Self-pay | Admitting: Certified Nurse Midwife

## 2017-06-27 ENCOUNTER — Other Ambulatory Visit (HOSPITAL_COMMUNITY)
Admission: RE | Admit: 2017-06-27 | Discharge: 2017-06-27 | Disposition: A | Discharge status: Home / Self Care | Payer: Medicare Other | Source: Ambulatory Visit | Attending: Obstetrics & Gynecology | Admitting: Obstetrics & Gynecology

## 2017-06-27 ENCOUNTER — Ambulatory Visit (INDEPENDENT_AMBULATORY_CARE_PROVIDER_SITE_OTHER): Payer: Medicare Other | Admitting: Certified Nurse Midwife

## 2017-06-27 VITALS — BP 120/74 | HR 68 | Resp 16 | Ht 62.0 in | Wt 123.0 lb

## 2017-06-27 DIAGNOSIS — Z124 Encounter for screening for malignant neoplasm of cervix: Secondary | ICD-10-CM

## 2017-06-27 DIAGNOSIS — Z780 Asymptomatic menopausal state: Secondary | ICD-10-CM | POA: Diagnosis not present

## 2017-06-27 DIAGNOSIS — N952 Postmenopausal atrophic vaginitis: Secondary | ICD-10-CM

## 2017-06-27 DIAGNOSIS — Z01419 Encounter for gynecological examination (general) (routine) without abnormal findings: Secondary | ICD-10-CM

## 2017-06-27 MED ORDER — ESTRADIOL 0.1 MG/GM VA CREA: TOPICAL_CREAM | VAGINAL | 3 refills | Status: DC

## 2017-06-27 NOTE — Patient Instructions (Signed)

## 2017-06-27 NOTE — Progress Notes (Signed)
71 y.o. [id number] Married  Caucasian Fe here for annual exam. Menopausal denies vaginal bleeding or vaginal dryness. Sees Dr. Rex Kras PCP yearly/aex/labs, hypothyroidism medication. All stable per patient. Uses vaginal Estrace cream as needed. Denies urinary incontinence or bowel issues. Does SBE monthly. Eating well and ambulates with no problems. No health issues today. Spent 3 months in [place] for the summer!   Patient's last menstrual period was 09/17/1996 (approximate).          Sexually active: No.  The current method of family planning is post menopausal status.    Exercising: Yes.    walking Smoker:  no  Health Maintenance: Pap:  06-08-14 neg History of Abnormal Pap: no MMG:  10-23-16 category c density birads 1:neg Self Breast exams: occ Colonoscopy:  2016 f/u 62yrs BMD:   2016 pcp TDaP:  2010 Shingles: 2014 or 2015 Pneumonia: per pcp Hep C and HIV: not done Labs: pcp   reports that she has never smoked. She has never used smokeless tobacco. She reports that she drinks about 4.2 oz of alcohol per week . She reports that she does not use drugs.  Past Medical History:  Diagnosis Date  . Arthritis   . Hx of colonic polyps 12/27/2014  . Hyperlipidemia   . 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 Vitamin D deficiency     Past Surgical History:  Procedure Laterality Date  . BREAST BIOPSY    . COLONOSCOPY  10-04-2009  . VAGINAL DELIVERY     x2    Current Outpatient Prescriptions  Medication Sig Dispense Refill  . Calcium Carbonate-Vit D-Min (CALCIUM 1200) 1200-1000 MG-UNIT CHEW Chew 1 capsule by mouth daily.    . Cholecalciferol (VITAMIN D-3) 1000 UNITS CAPS Take 1,000 Units by mouth daily.    Marland Kitchen estradiol (ESTRACE) 0.1 MG/GM vaginal cream Use 1/2 g vaginally twice weekly 42.5 g 3  . levothyroxine (SYNTHROID, LEVOTHROID) 75 MCG tablet Take 1 tablet by mouth daily.    . Multiple Vitamin (MULTIVITAMIN) tablet Take 1 tablet by mouth daily.    . multivitamin-lutein  (OCUVITE-LUTEIN) CAPS capsule Take 1 capsule by mouth daily.    . Omega-3 Krill Oil 500 MG CAPS     . Red Yeast Rice 600 MG CAPS Take 600 mg by mouth daily.     No current facility-administered medications for this visit.     Family History  Problem Relation Age of Onset  . Colon cancer Mother   . Heart disease Mother   . Alzheimer's disease Father   . Heart disease Father   . Leukemia Brother   . Breast cancer Maternal Grandmother   . Breast cancer Paternal Grandmother   . Paget's disease of bone Cousin   . Paget's disease of bone Cousin   . Polycythemia Brother   . Graves' disease Daughter   . Graves' disease Son   . Rectal cancer Neg Hx   . Stomach cancer Neg Hx     ROS:  Pertinent items are noted in HPI.  Otherwise, a comprehensive ROS was negative.  Exam:   BP 120/74   Pulse 68   Resp 16   Ht 5\' 2"  (1.575 m)   Wt 123 lb (55.8 kg)   LMP 09/17/1996 (Approximate)   BMI 22.50 kg/m  Height: 5\' 2"  (157.5 cm) Ht Readings from Last 3 Encounters:  06/27/17 5\' 2"  (1.575 m)  06/25/16 5' 2.25" (1.581 m)  06/24/15 5' 2.25" (1.581 m)    General appearance: alert, cooperative  and appears stated age Head: Normocephalic, without obvious abnormality, atraumatic Neck: no adenopathy, supple, symmetrical, trachea midline and thyroid normal to inspection and palpation Lungs: clear to auscultation bilaterally Breasts: normal appearance, no masses or tenderness, No nipple retraction or dimpling, No nipple discharge or bleeding, No axillary or supraclavicular adenopathy Heart: regular rate and rhythm Abdomen: soft, non-tender; no masses,  no organomegaly Extremities: extremities normal, atraumatic, no cyanosis or edema Skin: Skin color, texture, turgor normal. No rashes or lesions Lymph nodes: Cervical, supraclavicular, and axillary nodes normal. No abnormal inguinal nodes palpated Neurologic: Grossly normal   Pelvic: External genitalia:  no lesions              Urethra:  normal  appearing urethra with no masses, tenderness or lesions              Bartholin's and Skene's: normal                 Vagina: normal appearing vagina with normal color and discharge, no lesions              Cervix: no bleeding following Pap, no cervical motion tenderness and no lesions              Pap taken: Yes.   Bimanual Exam:  Uterus:  normal size, contour, position, consistency, mobility, non-tender              Adnexa: normal adnexa and no mass, fullness, tenderness               Rectovaginal: Confirms               Anus:  normal sphincter tone, no lesions  Chaperone present: yes  A:  Well Woman with normal exam  Menopausal no HRT  Atrophic vaginitis with Estrace use  Hypothyroidism with PCP management  P:   Reviewed health and wellness pertinent to exam  Discussed importance of reporting vaginal bleeding. Patient voiced understanding  Discussed risk/benefits of Estrace vaginal cream. Desires continuance, feels this helps with urinary urgency.  Rx Estrace see order with instructions.  Continue MD follow up as indicated  Pap smear: yes   counseled on breast self exam, mammography screening, feminine hygiene, osteoporosis, adequate intake of calcium and vitamin D, diet and exercise  return annually or prn  An After Visit Summary was printed and given to the patient.

## 2017-07-01 LAB — CYTOLOGY - PAP: Diagnosis: NEGATIVE

## 2017-07-12 ENCOUNTER — Other Ambulatory Visit: Payer: Self-pay | Admitting: Certified Nurse Midwife

## 2017-07-12 DIAGNOSIS — Z1231 Encounter for screening mammogram for malignant neoplasm of breast: Secondary | ICD-10-CM

## 2017-08-22 ENCOUNTER — Encounter: Payer: Self-pay | Admitting: Neurology

## 2017-08-22 ENCOUNTER — Ambulatory Visit (INDEPENDENT_AMBULATORY_CARE_PROVIDER_SITE_OTHER): Payer: Medicare Other | Admitting: Neurology

## 2017-08-22 DIAGNOSIS — R413 Other amnesia: Secondary | ICD-10-CM | POA: Diagnosis not present

## 2017-08-22 MED ORDER — DONEPEZIL HCL 10 MG PO TABS: 10.0000 mg | ORAL_TABLET | Freq: Every day | ORAL | 6 refills | Status: DC

## 2017-08-22 NOTE — Patient Instructions (Signed)
MRI brain email me B12 and methylmalonic acid also vitamin D Formal Neurocog testing Schedule appointment after formal testing and seeing Dr. Si Raider  Aricept 1/2 pill for 2 weeks then a whole pill  Donepezil tablets What is this medicine? DONEPEZIL (doe NEP e zil) is used to treat mild to moderate dementia caused by Alzheimer's disease. This medicine may be used for other purposes; ask your health care provider or pharmacist if you have questions. COMMON BRAND NAME(S): Aricept What should I tell my health care provider before I take this medicine? They need to know if you have any of these conditions: -asthma or other lung disease -difficulty passing urine -head injury -heart disease -history of irregular heartbeat -liver disease -seizures (convulsions) -stomach or intestinal disease, ulcers or stomach bleeding -an unusual or allergic reaction to donepezil, other medicines, foods, dyes, or preservatives -pregnant or trying to get pregnant -breast-feeding How should I use this medicine? Take this medicine by mouth with a glass of water. Follow the directions on the prescription label. You may take this medicine with or without food. Take this medicine at regular intervals. This medicine is usually taken before bedtime. Do not take it more often than directed. Continue to take your medicine even if you feel better. Do not stop taking except on your doctor's advice. If you are taking the 23 mg donepezil tablet, swallow it whole; do not cut, crush, or chew it. Talk to your pediatrician regarding the use of this medicine in children. Special care may be needed. Overdosage: If you think you have taken too much of this medicine contact a poison control center or emergency room at once. NOTE: This medicine is only for you. Do not share this medicine with others. What if I miss a dose? If you miss a dose, take it as soon as you can. If it is almost time for your next dose, take only that dose,  do not take double or extra doses. What may interact with this medicine? Do not take this medicine with any of the following medications: -certain medicines for fungal infections like itraconazole, fluconazole, posaconazole, and voriconazole -cisapride -dextromethorphan; quinidine -dofetilide -dronedarone -pimozide -quinidine -thioridazine -ziprasidone This medicine may also interact with the following medications: -antihistamines for allergy, cough and cold -atropine -bethanechol -carbamazepine -certain medicines for bladder problems like oxybutynin, tolterodine -certain medicines for Parkinson's disease like benztropine, trihexyphenidyl -certain medicines for stomach problems like dicyclomine, hyoscyamine -certain medicines for travel sickness like scopolamine -dexamethasone -ipratropium -NSAIDs, medicines for pain and inflammation, like ibuprofen or naproxen -other medicines for Alzheimer's disease -other medicines that prolong the QT interval (cause an abnormal heart rhythm) -phenobarbital -phenytoin -rifampin, rifabutin or rifapentine This list may not describe all possible interactions. Give your health care provider a list of all the medicines, herbs, non-prescription drugs, or dietary supplements you use. Also tell them if you smoke, drink alcohol, or use illegal drugs. Some items may interact with your medicine. What should I watch for while using this medicine? Visit your doctor or health care professional for regular checks on your progress. Check with your doctor or health care professional if your symptoms do not get better or if they get worse. You may get drowsy or dizzy. Do not drive, use machinery, or do anything that needs mental alertness until you know how this drug affects you. What side effects may I notice from receiving this medicine? Side effects that you should report to your doctor or health care professional as soon as possible: -allergic reactions  like  skin rash, itching or hives, swelling of the face, lips, or tongue -feeling faint or lightheaded, falls -loss of bladder control -seizures -signs and symptoms of a dangerous change in heartbeat or heart rhythm like chest pain; dizziness; fast or irregular heartbeat; palpitations; feeling faint or lightheaded, falls; breathing problems -signs and symptoms of infection like fever or chills; cough; sore throat; pain or trouble passing urine -signs and symptoms of liver injury like dark yellow or brown urine; general ill feeling or flu-like symptoms; light-colored stools; loss of appetite; nausea; right upper belly pain; unusually weak or tired; yellowing of the eyes or skin -slow heartbeat or palpitations -unusual bleeding or bruising -vomiting Side effects that usually do not require medical attention (report to your doctor or health care professional if they continue or are bothersome): -diarrhea, especially when starting treatment -headache -loss of appetite -muscle cramps -nausea -stomach upset This list may not describe all possible side effects. Call your doctor for medical advice about side effects. You may report side effects to FDA at 1-800-FDA-1088. Where should I keep my medicine? Keep out of reach of children. Store at room temperature between 15 and 30 degrees C (59 and 86 degrees F). Throw away any unused medicine after the expiration date. NOTE: This sheet is a summary. It may not cover all possible information. If you have questions about this medicine, talk to your doctor, pharmacist, or health care provider.  2018 Elsevier/Gold Standard (2016-02-20 21:00:42)    Dementia Dementia is the loss of two or more brain functions, such as:  Memory.  Decision making.  Behavior.  Speaking.  Thinking.  Problem solving.  There are many types of dementia. The most common type is called progressive dementia. Progressive dementia gets worse with time and it is irreversible.  An example of this type of dementia is Alzheimer disease. What are the causes? This condition may be caused by:  Nerve cell damage in the brain.  Genetic mutations.  Certain medicines.  Multiple small strokes.  An infection, such as chronic meningitis.  A metabolic problem, such as vitamin B12 deficiency or thyroid disease.  Pressure on the brain, such as from a tumor or blood clot.  What are the signs or symptoms? Symptoms of this condition include:  Sudden changes in mood.  Depression.  Problems with balance.  Changes in personality.  Poor short-term memory.  Agitation.  Delusions.  Hallucinations.  Having a hard time: ? Speaking thoughts. ? Finding words. ? Solving problems. ? Doing familiar tasks. ? Understanding familiar ideas.  How is this diagnosed? This condition is diagnosed with an assessment by your health care provider. During this assessment, your health care provider will talk with you and your family, friends, or caregivers about your symptoms. A thorough medical history will be taken, and you will have a physical exam and tests. Tests may include:  Lab tests, such as blood or urine tests.  Imaging tests, such as a CT scan, PET scan, or MRI.  A lumbar puncture. This test involves removing and testing a small amount of the fluid that surrounds the brain and spinal cord.  An electroencephalogram (EEG). In this test, small metal discs are used to measure electrical activity in the brain.  Memory tests, cognitive tests, and neuropsychological tests. These tests evaluate brain function.  How is this treated? Treatment depends on the cause of the dementia. It may involve taking medicines that may help:  To control the dementia.  To slow down the disease.  To manage symptoms.  In some cases, treating the cause of the dementia can improve symptoms, reverse symptoms, or slow down how quickly the dementia gets worse. Your health care provider  can help direct you to support groups, organizations, and other health care providers who can help with decisions about your care. Follow these instructions at home: Medicine  Take over-the-counter and prescription medicines only as told by your health care provider.  Avoid taking medicines that can affect thinking, such as pain or sleeping medicines. Lifestyle   Make healthy lifestyle choices: ? Be physically active as told by your health care provider. ? Do not use any tobacco products, such as cigarettes, chewing tobacco, and e-cigarettes. If you need help quitting, ask your health care provider. ? Eat a healthy diet. ? Practice stress-management techniques when you get stressed. ? Stay social.  Drink enough fluid to keep your urine clear or pale yellow.  Make sure to get quality sleep. These tips can help you to get a good night's rest: ? Avoid napping during the day. ? Keep your sleeping area dark and cool. ? Avoid exercising during the few hours before you go to bed. ? Avoid caffeine products in the evening. General instructions  Work with your health care provider to determine what you need help with and what your safety needs are.  If you were given a bracelet that tracks your location, make sure to wear it.  Keep all follow-up visits as told by your health care provider. This is important. Contact a health care provider if:  You have any new symptoms.  You have problems with choking or swallowing.  You have any symptoms of a different illness. Get help right away if:  You develop a fever.  You have new or worsening confusion.  You have new or worsening sleepiness.  You have a hard time staying awake.  You or your family members become concerned for your safety. This information is not intended to replace advice given to you by your health care provider. Make sure you discuss any questions you have with your health care provider. Document Released: 02/27/2001  Document Revised: 01/12/2016 Document Reviewed: 06/01/2015 Elsevier Interactive Patient Education  2017 [employer].

## 2017-08-22 NOTE — Progress Notes (Signed)
[facility]    Provider:  Dr Jaynee Eagles Referring Provider: Hulan Fess, MD Primary Care Physician:  Hulan Fess, MD  CC:  Cognitive impairments  HPI:  Meagan Schwartz is a 71 y.o. female here as a referral from Dr. Rex Kras for memory loss. PMHx thyroid disease, HLD, arthritis. Memory changes started 2 years ago, started with losing objects and significantly progressing. Husband is here and provides much information. She doesn't drive, she fel uncomfortable "mechanically" driving the car, she has been driving all her life, she was exercising every day and puzzles and that has stopped. Significant history of depression in family, mother and daughter and others. Husband provides most information. She says it is more a lack of interest as opposed to depression but she does get upset when she loses something. She socializes with neighbor and goes to lunch, that is becoming more infrequent. Husband pays the bills, she has never paid the bills. She cannot keep track of the day or date, she usually knows it is 2018, She has to be told things several times like time of appointment, she will repeat stories that she has talked about several times with family but doesn't do that with husband. Grandfather or Grandmother may have had Alzheimers, unsure. Parents passed at the age of 76 wihtout memory problems. Husband thinks they have had B12 labs. Denies significant etoh use.  Reviewed notes, labs and imaging from outside physicians, which showed:  Reviewed notes from neurology in June 2018 where she was seen for dizziness and a possible syncopal episode.  No history of seizures.  No recent changes in medications.  Patient reported she had been driving that day, she is never smoked, she does not drink alcohol, she is never used smokeless tobacco.  She reported occasional short-term memory complaints, and a family history of memory problems in her father and several relations who had Alzheimer's  disease.  Son-in-law noted that she wrote their names wrong and transpose numbers on recent card she sent them.  Review of Systems: Patient complains of symptoms per HPI as well as the following symptoms: memory loss. Pertinent negatives and positives per HPI. All others negative.   Social History   Socioeconomic History  . Marital status: Married    Spouse name: [name]  . Number of children: 2  . Years of education: Not on file  . Highest education level: Bachelor's degree (e.g., BA, AB, BS)  Social Needs  . Financial resource strain: Not on file  . Food insecurity - worry: Not on file  . Food insecurity - inability: Not on file  . Transportation needs - medical: Not on file  . Transportation needs - non-medical: Not on file  Occupational History  . Occupation: retired  Tobacco Use  . Smoking status: Former Research scientist (life sciences)  . Smokeless tobacco: Never Used  . Tobacco comment: smoked while in college  Substance and Sexual Activity  . Alcohol use: Yes    Alcohol/week: 4.2 oz    Types: 7 Glasses of wine per week  . Drug use: No  . Sexual activity: No    Partners: Male    Birth control/protection: Post-menopausal  Other Topics Concern  . Not on file  Social History Narrative   Lives at home with her husband   Right handed   About 1 cup of tea every 2 weeks     Family History  Problem Relation Age of Onset  . Colon cancer Mother   . Heart disease Mother   .  Alzheimer's disease Father   . Heart disease Father   . Leukemia Brother   . Breast cancer Maternal Grandmother   . Breast cancer Paternal Grandmother   . Paget's disease of bone Cousin   . Paget's disease of bone Cousin   . Polycythemia Brother   . Graves' disease Daughter   . Graves' disease Son   . Rectal cancer Neg Hx   . Stomach cancer Neg Hx     Past Medical History:  Diagnosis Date  . Arthritis   . Hx of colonic polyps 12/27/2014  . Hyperlipidemia   . 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 Vitamin D  deficiency     Past Surgical History:  Procedure Laterality Date  . BREAST BIOPSY    . COLONOSCOPY  10-04-2009  . VAGINAL DELIVERY     x2    Current Outpatient Medications  Medication Sig Dispense Refill  . Calcium Carbonate-Vit D-Min (CALCIUM 1200) 1200-1000 MG-UNIT CHEW Chew 1 capsule by mouth daily.    . Cholecalciferol (VITAMIN D-3) 1000 UNITS CAPS Take 1,000 Units by mouth daily.    Marland Kitchen estradiol (ESTRACE) 0.1 MG/GM vaginal cream Use 1/2 g vaginally twice weekly 42.5 g 3  . levothyroxine (SYNTHROID, LEVOTHROID) 75 MCG tablet Take 1 tablet by mouth daily.    . Multiple Vitamin (MULTIVITAMIN) tablet Take 1 tablet by mouth daily.    . multivitamin-lutein (OCUVITE-LUTEIN) CAPS capsule Take 1 capsule by mouth daily.    . Omega-3 Krill Oil 500 MG CAPS     . Red Yeast Rice 600 MG CAPS Take 600 mg by mouth daily.    Marland Kitchen donepezil (ARICEPT) 10 MG tablet Take 1 tablet (10 mg total) by mouth at bedtime. 30 tablet 6   No current facility-administered medications for this visit.     Allergies as of 08/22/2017 - Review Complete 08/22/2017  Allergen Reaction Noted  . Clindamycin  02/20/2017    Vitals: BP (!) 162/82 (BP Location: Right Arm, Patient Position: Sitting) Comment: husband reports BP 120s/80 at home and elevated at MD office  Pulse 70   Ht 5' 3.5" (1.613 m)   Wt 124 lb 6.4 oz (56.4 kg)   LMP 09/17/1996 (Approximate)   BMI 21.69 kg/m  Last Weight:  Wt Readings from Last 1 Encounters:  08/22/17 124 lb 6.4 oz (56.4 kg)   Last Height:   Ht Readings from Last 1 Encounters:  08/22/17 5' 3.5" (1.613 m)   Physical exam: Exam: Gen: NAD, Flat affect, psychomotor slowing                   CV: RRR, no MRG. No Carotid Bruits. No peripheral edema, warm, nontender Eyes: Conjunctivae clear without exudates or hemorrhage  MMSE - Mini Mental State Exam 08/22/2017  Orientation to time 2  Orientation to Place 3  Registration 3  Attention/ Calculation 2  Recall 0  Language- name 2  objects 2  Language- repeat 1  Language- follow 3 step command 3  Language- read & follow direction 1  Write a sentence 1  Copy design 0  Total score 18    Neuro: Detailed Neurologic Exam Speech:    Speech is normal; fluent and spontaneous with normal comprehension.  Cognition:    The patient is oriented to person, place, and time;     recent and remote memory intact;     language fluent;     normal attention, concentration,     fund of knowledge Cranial Nerves:  The pupils are equal, round, and reactive to light. Attempted fundoscopic exam could not visualize. Visual fields are full to finger confrontation. Extraocular movements are intact. Trigeminal sensation is intact and the muscles of mastication are normal. The face is symmetric. The palate elevates in the midline. Hearing intact. Voice is normal. Shoulder shrug is normal. The tongue has normal motion without fasciculations.   Coordination:    Normal finger to nose and heel to shin.   Gait:    Heel-toe and tandem gait are normal.   Motor Observation:    No asymmetry, no atrophy, and no involuntary movements noted. Tone:    Normal muscle tone.    Posture:    Posture is normal. normal erect    Strength:    Strength is V/V in the upper and lower limbs.      Sensation: intact to LT     Reflex Exam:  DTR's:    Deep tendon reflexes in the upper and lower extremities are brisk bilaterally.   Toes:    The toes are equivocal bilaterally.   Clonus:    Clonus is absent.       Assessment/Plan:  71 year old female here with her husband, reporting progressive memory loss. She has a Flat affect, psychomotor slowing, extensive family history of mood disorder. She denies any depression or mood disorder. May be Alzheimer dementia vs pseudodementia.  Patient denies Alzheimer's disease in first-degree relatives today however when reviewing chart she had reported her father had memory problems when she saw [facility]  neurology.  pcp notes so small can't be read, will request again MRI of the brain to evaluate for reversible causes of dementia B12 normal (>1000) Formal neurocognitive testing  Orders Placed This Encounter  Procedures  . MR BRAIN WO CONTRAST  . Ambulatory referral to Neuropsychology    Cc: Dr. Simmie Davies, [facility] [address] [place], [place] [address]  Phone [phone] Fax [phone]

## 2017-08-23 ENCOUNTER — Encounter: Payer: Self-pay | Admitting: Psychology

## 2017-08-29 ENCOUNTER — Ambulatory Visit (INDEPENDENT_AMBULATORY_CARE_PROVIDER_SITE_OTHER): Payer: Medicare Other | Admitting: Psychology

## 2017-08-29 ENCOUNTER — Encounter: Payer: Self-pay | Admitting: Psychology

## 2017-08-29 DIAGNOSIS — R413 Other amnesia: Secondary | ICD-10-CM | POA: Diagnosis not present

## 2017-08-29 NOTE — Progress Notes (Signed)
   Neuropsychology Note  Meagan Schwartz came in today for 1 hour of neuropsychological testing with technician, Milana Kidney, BS, under the supervision of Dr. Macarthur Critchley. The patient did appear anxious and several times stated that she could not do this and wanted to leave, per behavioral observation or via self-report to the technician. Patient was encouraged to stay and rest breaks were offered. Meagan Schwartz will return within 2 weeks for a feedback session with Dr. Si Raider at which time her test performances, clinical impressions and treatment recommendations will be reviewed in detail. The patient understands she can contact our office should she require our assistance before this time.  Full report to follow.

## 2017-08-29 NOTE — Progress Notes (Signed)
NEUROPSYCHOLOGICAL INTERVIEW (CPT: [id number])  Name: Meagan Schwartz Date of Birth: 1945-11-27 Date of Interview: 08/29/2017  Reason for Referral:  Meagan Schwartz is a 71 y.o. female who is referred for neuropsychological evaluation by Dr. Sarina Ill of [facility] due to concerns about memory loss. This patient is accompanied in the office by her husband who supplements the history.  History of Presenting Problem:  Meagan Schwartz was seen by Dr. Jaynee Eagles for initial neurology consultation on 08/22/2017 for memory loss. MMSE was 18/30. She was started on Aricept and referred for neuropsychological testing as well as brain MRI; she will have the latter on 09/23/2017.  At today's visit (08/29/2017), the patient endorses some memory problems but relies on her husband to provide most of the details. He states gradual onset of memory problems at least 2 years ago, progressively worsening over time. He reports short term memory loss characterized by forgetting recent conversations and repeating herself. He also reports personality changes characterized by reduced interest and activity. She used to exercise daily on her stationary bike and do puzzles on her iPad; now she no longer does either of these activities. This stopped about a year ago. She also stopped driving completely about a year ago. She is vague about why. She states she knew "I'm not all there" and didn't want to cause an accident. She denied history of getting lost. The patient admits she used to be more outgoing and social and now she prefers to keep to herself. When they went to [place] this past summer, as they do every year, she did not go visit any family or friends the whole summer, which was unusual. She describes her mood as "more depressed". She denies dysphoria or tearfulness but does know she is less interested in things like socializing. She denies suicidal ideation or intention. She has no personal history of major  depressive episodes nor treatment for any mental health condition. She has a family history of depression (mother and daughter). She has not had any hallucinations. She has had some stress over the past 6 months related to her daughter unexpectedly getting a divorce. The patient denies family history of AD in any first degree relatives; however, as Dr. Jaynee Eagles noted, when she saw [facility] neurology for dizziness in June 2018, she reported her father had memory problems.  The patient lives with her husband in their own home. As noted previously, she is no longer driving. She does manage her medications (has two Rx medications) independently without any reported difficulty. Her husband has always managed the bills/finances. She used to do the cooking but he has taken that over (she is not sure why). Her husband manages her appointments. She does some housekeeping and laundry and does not have difficulty with these tasks. She is independent with all basic ADLs.  She has trouble remembering the day and the date but always knows the year. She recognizes family and friends. She does not seem to forget recent events. She does not demonstrate distractibility or trouble staying on task. She and her husband deny any change in language or communication, aside from more spelling difficulty per her husband.   She denies any physical complaints. She denies any chronic pain. She denies difficulty with walking. She has not had any falls. She denies any changes in sleep habits. She denies sleep difficulty. She does report reduced energy. She denies taking daytime naps. She reports reduced appetite and reduced interest in eating. She has one small glass of  white wine daily.   Social History: Born/Raised: Born in [place], moved a lot as father was in the [employer] Education: Gaffer Occupational history: Mostly homemaker Marital history: Married with 2 adult children (daughter in [place] and son in [place]) and 3  granddaughters Alcohol: One glass of wine daily Tobacco: None, never a regular smoker or tobacco user (used some for less than a year in college only)   Medical History: Past Medical History:  Diagnosis Date  . Arthritis   . Hx of colonic polyps 12/27/2014  . Hyperlipidemia   . 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 Vitamin D deficiency      Current Medications:  Outpatient Encounter Medications as of 08/29/2017  Medication Sig  . Calcium Carbonate-Vit D-Min (CALCIUM 1200) 1200-1000 MG-UNIT CHEW Chew 1 capsule by mouth daily.  . Cholecalciferol (VITAMIN D-3) 1000 UNITS CAPS Take 1,000 Units by mouth daily.  Marland Kitchen donepezil (ARICEPT) 10 MG tablet Take 1 tablet (10 mg total) by mouth at bedtime.  Marland Kitchen estradiol (ESTRACE) 0.1 MG/GM vaginal cream Use 1/2 g vaginally twice weekly  . levothyroxine (SYNTHROID, LEVOTHROID) 75 MCG tablet Take 1 tablet by mouth daily.  . Multiple Vitamin (MULTIVITAMIN) tablet Take 1 tablet by mouth daily.  . multivitamin-lutein (OCUVITE-LUTEIN) CAPS capsule Take 1 capsule by mouth daily.  . Omega-3 Krill Oil 500 MG CAPS   . Red Yeast Rice 600 MG CAPS Take 600 mg by mouth daily.   No facility-administered encounter medications on file as of 08/29/2017.      Behavioral Observations:   Appearance: Casually and appropriately dressed and groomed Gait: Ambulated independently, slow gait, otherwise no gross abnormalities observed Speech: Fluent; somewhat reduced rate, somewhat monotone and mildly reduced volume. Mild word finding difficulty observed (could not recall name of the university she graduated from). Thought process: Appears linear Affect: Blunted Interpersonal: Pleasant, appropriate   TESTING: There is medical necessity to proceed with neuropsychological assessment as the results will be used to aid in differential diagnosis and clinical decision-making and to inform specific treatment recommendations. Per the patient, her husband and medical  records reviewed, there has been a change in cognitive functioning and a reasonable suspicion of dementia.  Following the clinical interview, the patient completed a full battery of neuropsychological testing with my psychometrician under my supervision.   PLAN: The patient will return to see me for a follow-up session at which time her test performances and my impressions and treatment recommendations will be reviewed in detail.  Full report to follow.

## 2017-09-03 NOTE — Progress Notes (Signed)
NEUROPSYCHOLOGICAL EVALUATION   Name:    Meagan Schwartz  Date of Birth:   05/08/1946 Date of Interview:  08/29/2017 Date of Testing:  08/29/2017   Date of Feedback:  09/05/2017       Background Information:  Reason for Referral:  Meagan Schwartz is a 71 y.o. female referred by Dr. Sarina Ill to assess her current level of cognitive functioning and assist in differential diagnosis. The current evaluation consisted of a review of available medical records, an interview with the patient and her husband, and the completion of a neuropsychological testing battery. Informed consent was obtained.  History of Presenting Problem:  Meagan Schwartz was seen by Dr. Jaynee Eagles for initial neurology consultation on 08/22/2017 for memory loss. MMSE was 18/30. She was started on Aricept and referred for neuropsychological testing as well as brain MRI; she will have the latter on 09/23/2017.  At today's visit (08/29/2017), the patient endorses some memory problems but relies on her husband to provide most of the details. He states gradual onset of memory problems at least 2 years ago, progressively worsening over time. He reports short term memory loss characterized by forgetting recent conversations and repeating herself. He also reports personality changes characterized by reduced interest and activity. She used to exercise daily on her stationary bike and do puzzles on her iPad; now she no longer does either of these activities. This stopped about a year ago. She also stopped driving completely about a year ago. She is vague about why. She states she knew "I'm not all there" and didn't want to cause an accident. She denied history of getting lost. The patient admits she used to be more outgoing and social and now she prefers to keep to herself. When they went to [place] this past summer, as they do every year, she did not go visit any family or friends the whole summer, which was unusual. She describes her mood as  "more depressed". She denies dysphoria or tearfulness but does know she is less interested in things like socializing. She denies suicidal ideation or intention. She has no personal history of major depressive episodes nor treatment for any mental health condition. She has a family history of depression (mother and daughter). She has not had any hallucinations. She has had some stress over the past 6 months related to her daughter unexpectedly getting a divorce. The patient denies family history of AD in any first degree relatives; however, as Dr. Jaynee Eagles noted, when she saw [facility] neurology for dizziness in June 2018, she reported her father had memory problems.  The patient lives with her husband in their own home. As noted previously, she is no longer driving. She does manage her medications (has two Rx medications) independently without any reported difficulty. Her husband has always managed the bills/finances. She used to do the cooking but he has taken that over (she is not sure why). Her husband manages her appointments. She does some housekeeping and laundry and does not have difficulty with these tasks. She is independent with all basic ADLs.  She has trouble remembering the day and the date but "always knows the year" (but she reported the current year as 2020). She recognizes family and friends. She does not seem to forget recent events. She does not demonstrate distractibility or trouble staying on task. She and her husband deny any change in language or communication, aside from more spelling difficulty per her husband.   She denies any physical complaints. She denies  any chronic pain. She denies difficulty with walking. She has not had any falls. She denies any changes in sleep habits. She denies sleep difficulty. She does report reduced energy. She denies taking daytime naps. She reports reduced appetite and reduced interest in eating. She has one small glass of white wine daily.   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 Arthritis   . Hx of colonic polyps 12/27/2014  . Hyperlipidemia   . 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 Vitamin D deficiency     Current medications:  Outpatient Encounter Medications as of 09/05/2017  Medication Sig  . Calcium Carbonate-Vit D-Min (CALCIUM 1200) 1200-1000 MG-UNIT CHEW Chew 1 capsule by mouth daily.  . Cholecalciferol (VITAMIN D-3) 1000 UNITS CAPS Take 1,000 Units by mouth daily.  Marland Kitchen donepezil (ARICEPT) 10 MG tablet Take 1 tablet (10 mg total) by mouth at bedtime.  Marland Kitchen estradiol (ESTRACE) 0.1 MG/GM vaginal cream Use 1/2 g vaginally twice weekly  . levothyroxine (SYNTHROID, LEVOTHROID) 75 MCG tablet Take 1 tablet by mouth daily.  . Multiple Vitamin (MULTIVITAMIN) tablet Take 1 tablet by mouth daily.  . multivitamin-lutein (OCUVITE-LUTEIN) CAPS capsule Take 1 capsule by mouth daily.  . Omega-3 Krill Oil 500 MG CAPS   . Red Yeast Rice 600 MG CAPS Take 600 mg by mouth daily.   No facility-administered encounter medications on file as of 09/05/2017.      Current Examination:  Behavioral Observations:  Appearance: Casually and appropriately dressed and groomed Gait: Ambulated independently, slow gait, otherwise no gross abnormalities observed Speech: Fluent; somewhat reduced rate, somewhat monotone and mildly reduced volume. Mild word finding difficulty observed (could not recall name of the university she graduated from). Thought process: Appears linear Affect: Blunted Interpersonal: Pleasant, appropriate during  the interview. Very anxious during the testing battery. She required significant encouragement to complete the testing session. Orientation: Oriented to person, place and current month. Disoriented to current date, year or day of the week. Unable to name the current President or his predecessor.  Tests Administered: . Test of Premorbid Functioning (TOPF) . Wechsler Adult Intelligence Scale-Fourth Edition (WAIS-IV): Similarities, Music therapist, Coding and Digit Span subtests . Wechsler Memory Scale-Fourth Edition (WMS-IV) Older Adult Version (ages 37-90): Logical Memory I, II and Recognition subtests  . [employer] Verbal Learning Test - 2nd Edition (CVLT-2) Short Form . Repeatable Battery for the Assessment of Neuropsychological Status (RBANS) Form A:  Figure Copy and Recall subtests and Semantic Fluency subtest . Neuropsychological Assessment Battery (NAB) Language Module, Form 1: Naming subtest . Boston Diagnostic Aphasia Examination: Complex Ideational Material subtest . Controlled Oral Word Association Test (COWAT) . Trail Making Test A and B . Clock drawing test . Geriatric Depression Scale (GDS) 15 Item . Generalized Anxiety Disorder - 7 item screener (GAD-7)  Test Results: Note: Standardized scores are presented only for use by appropriately trained professionals and to allow for any future test-retest comparison. These scores should not be interpreted without consideration of all the information that is contained in the rest of the report. The most recent standardization samples  from the test publisher or other sources were used whenever possible to derive standard scores; scores were corrected for age, gender, ethnicity and education when available.   Test Scores:  Test Name Raw Score Standardized Score Descriptor  TOPF 39/70 SS= 99 Average  WAIS-IV Subtests     Similarities 17/36 ss= 7 Low average  Block Design 13/66 ss= 5 Borderline  Coding 21/135 ss= 4 Impaired  Digit Span  Forward 10/16 ss= 10 Average  Digit Span Backward 4/16 ss= 5 Borderline  WMS-IV Subtests     LM I 12/53 ss= 3 Impaired  LM II 0/39 ss= 1 Impaired  LM II Recognition 15/23 Cum %: 10-16 Impaired  RBANS Subtests     Figure Copy 14/20 Z= -2.1 Impaired  Figure Recall 2/20 Z= -2.5 Impaired  Semantic Fluency 7/40 Z= -2.5 Impaired  CVLT-II Scores     Trial 1 0/9 Z= -3.5 Severely impaired  Trial 4 5/9 Z= -2 Impaired  Trials 1-4 total 11/36 T= 16 Severely impaired  SD Free Recall 3/9 Z= -2 Impaired  LD Free Recall 0/9 Z= -2 Impaired  LD Cued Recall 0/9 Z= -3 Severely impaired  Recognition Discriminability 6/9 hits, 9 false positives Z= -2.5 Impaired  Forced Choice Recognition 9/9  WNL  NAB Language subtest     Naming 19/31 T= 19 Severely impaired  BDAE Subtest     Complex Ideational Material 6/12  Impaired  COWAT-FAS 7 T= 21 Severely impaired  COWAT-Animals 10 T= 30 Impaired  Trail Making Test A  189" 0 errors T= -48 Severely impaired  Trail Making Test B  Pt refused     Clock Drawing   Impaired  GDS-15 4/15  WNL  GAD-7 0/21  WNL      Description of Test Results:  Premorbid verbal intellectual abilities were estimated to have been within the average range based on a test of word reading. Psychomotor processing speed was impaired. Basic auditory attention was average, while more complex auditory attention (ie working memory) was borderline impaired. Visual-spatial construction was impaired. Language abilities were impaired. Specifically, confrontation naming was severely impaired, and semantic verbal fluency was impaired. Auditory comprehension of complex ideational material was severely impaired. With regard to verbal memory, encoding and acquisition of non-contextual information (i.e., word list) was severely impaired. After a brief distracter task, free recall was impaired (3/9 items recalled). After a delay, free recall was impaired (0/9 items recalled). Cued recall was severely  impaired (0/9 items recalled). Performance on a yes/no recognition task was severely impaired with a high number of false positive errors. On another verbal memory test, encoding and acquisition of contextual auditory information (i.e., short stories) was impaired. After a delay, free recall was severely impaired. Performance on a yes/no recognition task was impaired. With regard to non-verbal memory, delayed free recall of visual information was impaired. Executive functioning was largely below expectation. Verbal fluency with phonemic search restrictions was severely impaired. Verbal abstract reasoning was low average. Performance on a clock drawing task was impaired. Mental flexibility and set-shifting were unable to be assessed as the patient refused to complete Trails [address]. On self-report measures of mood, the patient's responses were not indicative of clinically significant depression or anxiety at the present time.    Clinical Impressions: Moderate dementia (rule out Alzheimer's disease). Results of formal neuropsychological testing revealed severe and rather global cognitive deficits. She demonstrated temporal disorientation, severely impaired encoding and consolidation, visual-spatial skills and language abilities. There was also evidence of executive dysfunction. Psychomotor processing  speed was significantly slowed. The only normal performances on testing were in basic attention and verbal abstraction. This level of impairment is certainly indicative of dementia, and due to the severity and global nature of impairment I would characterize this as moderate stage. Etiology is more difficult to determine at the moderate stage, and we do not yet have neuroimaging, but it does seem likely that the patient has Alzheimer's disease. Neuroimaging will assist in determining if there is a vascular component as well. The patient denied symptoms of depression and anxiety on self-report measures but during the  clinical interview she and her husband reported significantly reduced interest in activities and socializing, which I suspect is secondary to underlying neuropathology and a behavioral feature of her dementia.    Recommendations/Plan: Based on the findings of the present evaluation, the following recommendations are offered:  1. The patient requires assistance with all complex ADLs. Her husband is providing this. She is no longer driving. Her husband may wish to consider in-home caregiving assistance for respite. They may also wish to consider moving to a continuing care retirement community if this is feasible for them.  2. She appears to be an appropriate candidate for cholinesterase inhibitor therapy. She is already taking Aricept. The addition of Namenda could be considered. She will speak with Dr. Jaynee Eagles about this. 3. Her husband/family will benefit from education and support regarding her diagnosis. I provided some written information from the Alzheimer's Association as well as a list of local resources/contacts.   Feedback to Patient: Falesha Schommer Neeb returned for a feedback appointment on 09/05/2017 to review the results of her neuropsychological evaluation with this provider. 20 minutes face-to-face time was spent reviewing her test results, my impressions and my recommendations as detailed above.    Total time spent on this patient's case: [id number] unit for interview with psychologist; [phone] units of testing by psychometrician under psychologist's supervision; [phone] units for medical record review, scoring of neuropsychological tests, interpretation of test results, preparation of this report, and review of results to the patient by psychologist.      Thank you for your referral of [place]. Please feel free to contact me if you have any questions or concerns regarding this report.

## 2017-09-05 ENCOUNTER — Ambulatory Visit (INDEPENDENT_AMBULATORY_CARE_PROVIDER_SITE_OTHER): Payer: Medicare Other | Admitting: Psychology

## 2017-09-05 ENCOUNTER — Encounter: Payer: Self-pay | Admitting: Psychology

## 2017-09-05 DIAGNOSIS — F03B0 Unspecified dementia, moderate, without behavioral disturbance, psychotic disturbance, mood disturbance, and anxiety: Secondary | ICD-10-CM

## 2017-09-05 DIAGNOSIS — F0390 Unspecified dementia without behavioral disturbance: Secondary | ICD-10-CM | POA: Diagnosis not present

## 2017-09-05 NOTE — Patient Instructions (Addendum)
Clinical Impressions: Moderate dementia (rule out Alzheimer's disease). Results of formal neuropsychological testing revealed severe and rather global cognitive deficits. She demonstrated temporal disorientation, severely impaired encoding and consolidation, visual-spatial skills and language abilities. There was also evidence of executive dysfunction. Psychomotor processing speed was significantly slowed. The only normal performances on testing were in basic attention and verbal abstraction. This level of impairment is certainly indicative of dementia, and due to the severity and global nature of impairment I would characterize this as moderate stage. Etiology is more difficult to determine at the moderate stage, and we do not yet have neuroimaging, but it does seem likely that the patient has Alzheimer's disease. Neuroimaging will assist in determining if there is a vascular component as well. The patient denied symptoms of depression and anxiety on self-report measures but during the clinical interview she and her husband reported significantly reduced interest in activities and socializing, which I suspect is secondary to underlying neuropathology and a behavioral feature of her dementia.  Recommendations/Plan: Based on the findings of the present evaluation, the following recommendations are offered:  1. The patient requires assistance with all complex ADLs. Her husband is providing this. She is no longer driving. Her husband may wish to consider in-home caregiving assistance for respite. They may also wish to consider moving to a continuing care retirement community if this is feasible for them.  2. She appears to be an appropriate candidate for cholinesterase inhibitor therapy. She is already taking Aricept. The addition of Namenda could be considered. She will speak with Dr. Jaynee Eagles about this. 3. Her husband/family will benefit from education and support regarding her diagnosis. I provided some  written information from the Alzheimer's Association as well as a list of local resources/contacts.

## 2017-09-07 NOTE — Progress Notes (Signed)
Meagan Schwartz, Can you make sure she has a follow up appointment? They can see Jinny Blossom, thanks

## 2017-09-11 ENCOUNTER — Telehealth: Payer: Self-pay

## 2017-09-11 NOTE — Telephone Encounter (Signed)
I called pt's husband, per Dr. Jaynee Eagles request to schedule pt with Jinny Blossom, NP in April of 2019. Pt's husband is agreeable to an appt on 12/31/17 at 1:00pm. I advised pt's husband that we will call them with MRI results when they become available and if a sooner appt is needed, we can move this appt up. Pt's husband verbalized understanding.

## 2017-09-20 ENCOUNTER — Encounter: Payer: Self-pay | Admitting: Psychology

## 2017-09-23 ENCOUNTER — Ambulatory Visit
Admission: RE | Admit: 2017-09-23 | Discharge: 2017-09-23 | Disposition: A | Discharge status: Home / Self Care | Payer: Medicare Other | Source: Ambulatory Visit | Attending: Neurology | Admitting: Neurology

## 2017-09-23 DIAGNOSIS — R413 Other amnesia: Secondary | ICD-10-CM

## 2017-09-24 ENCOUNTER — Encounter: Payer: Self-pay | Admitting: Neurology

## 2017-09-26 ENCOUNTER — Telehealth: Payer: Self-pay | Admitting: *Deleted

## 2017-09-26 NOTE — Telephone Encounter (Addendum)
Called and spoke with pt's husband Meagan Schwartz (on [place]). I discussed that Meagan Schwartz's MRI brain does show changing associated with Alzheimer's disease. He verbalized understanding and I stated that Dr. Jaynee Eagles would like to f/u with them in the office. I scheduled an appt for 10/09/17 @ 10:00 am with an arrival time of 9:30 am. He verbalized understanding. Will discuss whether or not Meagan Schwartz appt is needed at the upcoming January appt.   ----- Message from Melvenia Beam, MD sent at 09/24/2017  2:14 PM EST ----- Brain MRI shows changing associated with Alzheimer's disease. Can discuss in detail at follow up with megan.

## 2017-10-06 ENCOUNTER — Encounter: Payer: Self-pay | Admitting: Psychology

## 2017-10-09 ENCOUNTER — Encounter: Payer: Self-pay | Admitting: Neurology

## 2017-10-09 ENCOUNTER — Ambulatory Visit (INDEPENDENT_AMBULATORY_CARE_PROVIDER_SITE_OTHER): Payer: Medicare Other | Admitting: Neurology

## 2017-10-09 VITALS — BP 115/69 | HR 62 | Ht 64.0 in | Wt 122.0 lb

## 2017-10-09 DIAGNOSIS — G301 Alzheimer's disease with late onset: Secondary | ICD-10-CM | POA: Diagnosis not present

## 2017-10-09 DIAGNOSIS — F329 Major depressive disorder, single episode, unspecified: Secondary | ICD-10-CM | POA: Diagnosis not present

## 2017-10-09 DIAGNOSIS — H5203 Hypermetropia, bilateral: Secondary | ICD-10-CM | POA: Diagnosis not present

## 2017-10-09 DIAGNOSIS — H2513 Age-related nuclear cataract, bilateral: Secondary | ICD-10-CM | POA: Diagnosis not present

## 2017-10-09 DIAGNOSIS — F0280 Dementia in other diseases classified elsewhere without behavioral disturbance: Secondary | ICD-10-CM | POA: Diagnosis not present

## 2017-10-09 DIAGNOSIS — H524 Presbyopia: Secondary | ICD-10-CM | POA: Diagnosis not present

## 2017-10-09 DIAGNOSIS — F32A Depression, unspecified: Secondary | ICD-10-CM

## 2017-10-09 DIAGNOSIS — H52203 Unspecified astigmatism, bilateral: Secondary | ICD-10-CM | POA: Diagnosis not present

## 2017-10-09 MED ORDER — MEMANTINE HCL 5 MG PO TABS: 5.0000 mg | ORAL_TABLET | Freq: Two times a day (BID) | ORAL | 6 refills | Status: DC

## 2017-10-09 NOTE — Progress Notes (Signed)
Meagan Schwartz    Provider:  Dr Jaynee Eagles Referring Provider: Hulan Fess, MD Primary Care Physician:  Hulan Fess, MD  CC:  Cognitive impairments  Interval history: Patient and husband return for follow up, formal neurocognitive testing confirmed moderate dementia. MRI of the brain c/w alzheimer's pathology.  Reviewed MRI images with patient and husband.  Also discussed dementia, and discussed her formal neurocognitive testing today.  Discussed medications that we use to slow down progression of dementia.  Also discussed our clinical trials which they are not interested in.   Dr. Bonita Quin: Clinical Impressions: Moderate dementia (rule out Alzheimer's disease). Results of formal neuropsychological testing revealed severe and rather global cognitive deficits.   Recommendations/Plan: Based on the findings of the present evaluation, the following recommendations are offered:  1. The patient requires assistance with all complex ADLs. Her husband is providing this. She is no longer driving. Her husband may wish to consider in-home caregiving assistance for respite. They may also wish to consider moving to a continuing care retirement community if this is feasible for them.  2. She appears to be an appropriate candidate for cholinesterase inhibitor therapy. She is already taking Aricept. The addition of Namenda could be considered. She will speak with Dr. Jaynee Eagles about this. 3. Her husband/family will benefit from education and support regarding her diagnosis. I provided some written information from the Alzheimer's Association as well as a list of local resources/contacts.     HPI:  Meagan Schwartz is a 72 y.o. female here as a referral from Dr. Rex Kras for memory loss. PMHx thyroid disease, HLD, arthritis. Memory changes started 2 years ago, started with losing objects and significantly progressing. Husband is here and provides much information. She doesn't drive, she fel  uncomfortable "mechanically" driving the car, she has been driving all her life, she was exercising every day and puzzles and that has stopped. Significant history of depression in family, mother and daughter and others. Husband provides most information. She says it is more a lack of interest as opposed to depression but she does get upset when she loses something. She socializes with neighbor and goes to lunch, that is becoming more infrequent. Husband pays the bills, she has never paid the bills. She cannot keep track of the day or date, she usually knows it is 2018, She has to be told things several times like time of appointment, she will repeat stories that she has talked about several times with family but doesn't do that with husband. Grandfather or Grandmother may have had Alzheimers, unsure. Parents passed at the age of 41 wihtout memory problems. Husband thinks they have had B12 labs. Denies significant etoh use.  Reviewed notes, labs and imaging from outside physicians, which showed:  Reviewed notes from neurology in June 2018 where she was seen for dizziness and a possible syncopal episode.  No history of seizures.  No recent changes in medications.  Patient reported she had been driving that day, she is never smoked, she does not drink alcohol, she is never used smokeless tobacco.  She reported occasional short-term memory complaints, and a family history of memory problems in her father and several relations who had Alzheimer's disease.  Son-in-law noted that she wrote their names wrong and transpose numbers on recent card she sent them.  Review of Systems: Patient complains of symptoms per HPI as well as the following symptoms: memory loss. Pertinent negatives and positives per HPI. All others negative.   Social History   Socioeconomic  History  . Marital status: Married    Spouse name: [name]  . Number of children: 2  . Years of education: Not on file  . Highest education level:  Bachelor's degree (e.g., BA, AB, BS)  Social Needs  . Financial resource strain: Not on file  . Food insecurity - worry: Not on file  . Food insecurity - inability: Not on file  . Transportation needs - medical: Not on file  . Transportation needs - non-medical: Not on file  Occupational History  . Occupation: retired  Tobacco Use  . Smoking status: Former Research scientist (life sciences)  . Smokeless tobacco: Never Used  . Tobacco comment: smoked while in college  Substance and Sexual Activity  . Alcohol use: Yes    Alcohol/week: 4.2 oz    Types: 7 Glasses of wine per week  . Drug use: No  . Sexual activity: No    Partners: Male    Birth control/protection: Post-menopausal  Other Topics Concern  . Not on file  Social History Narrative   Lives at home with her husband and two dogs   Right handed   About 1 cup of tea every 2 weeks     Family History  Problem Relation Age of Onset  . Colon cancer Mother   . Heart disease Mother   . Alzheimer's disease Father   . Heart disease Father   . Leukemia Brother   . Breast cancer Maternal Grandmother   . Breast cancer Paternal Grandmother   . Paget's disease of bone Cousin   . Paget's disease of bone Cousin   . Polycythemia Brother   . Graves' disease Daughter   . Graves' disease Son   . Rectal cancer Neg Hx   . Stomach cancer Neg Hx     Past Medical History:  Diagnosis Date  . Arthritis   . Hx of colonic polyps 12/27/2014  . Hyperlipidemia   . 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 Vitamin D deficiency     Past Surgical History:  Procedure Laterality Date  . BREAST BIOPSY    . COLONOSCOPY  10-04-2009  . VAGINAL DELIVERY     x2    Current Outpatient Medications  Medication Sig Dispense Refill  . Calcium Carbonate-Vit D-Min (CALCIUM 1200) 1200-1000 MG-UNIT CHEW Chew 1 capsule by mouth daily.    . Cholecalciferol (VITAMIN D-3) 1000 UNITS CAPS Take 1,000 Units by mouth daily.    Marland Kitchen donepezil (ARICEPT) 10 MG tablet Take 1 tablet (10 mg  total) by mouth at bedtime. 30 tablet 6  . estradiol (ESTRACE) 0.1 MG/GM vaginal cream Use 1/2 g vaginally twice weekly 42.5 g 3  . levothyroxine (SYNTHROID, LEVOTHROID) 75 MCG tablet Take 1 tablet by mouth daily.    . Multiple Vitamin (MULTIVITAMIN) tablet Take 1 tablet by mouth daily.    . multivitamin-lutein (OCUVITE-LUTEIN) CAPS capsule Take 1 capsule by mouth daily.    . Omega-3 Krill Oil 500 MG CAPS     . Red Yeast Rice 600 MG CAPS Take 600 mg by mouth daily.    . memantine (NAMENDA) 5 MG tablet Take 1 tablet (5 mg total) by mouth 2 (two) times daily. 60 tablet 6   No current facility-administered medications for this visit.     Allergies as of 10/09/2017 - Review Complete 10/09/2017  Allergen Reaction Noted  . Clindamycin  02/20/2017    Vitals: BP 115/69 (BP Location: Right Arm, Patient Position: Sitting)   Pulse 62   Ht 5\' 4"  (  1.626 m)   Wt 122 lb (55.3 kg)   LMP 09/17/1996 (Approximate)   BMI 20.94 kg/m  Last Weight:  Wt Readings from Last 1 Encounters:  10/09/17 122 lb (55.3 kg)   Last Height:   Ht Readings from Last 1 Encounters:  10/09/17 5\' 4"  (1.626 m)   Physical exam: Exam: Gen: NAD, Flat affect, psychomotor slowing                   CV: RRR, no MRG. No Carotid Bruits. No peripheral edema, warm, nontender Eyes: Conjunctivae clear without exudates or hemorrhage  MMSE - Mini Mental State Exam 08/22/2017  Orientation to time 2  Orientation to Place 3  Registration 3  Attention/ Calculation 2  Recall 0  Language- name 2 objects 2  Language- repeat 1  Language- follow 3 step command 3  Language- read & follow direction 1  Write a sentence 1  Copy design 0  Total score 18    Neuro: Detailed Neurologic Exam Speech:    Speech is normal; fluent and spontaneous with normal comprehension.  Cognition:    The patient is oriented to person, place, and time;     recent and remote memory intact;     language fluent;     normal attention, concentration,      fund of knowledge Cranial Nerves:    The pupils are equal, round, and reactive to light. Attempted fundoscopic exam could not visualize. Visual fields are full to finger confrontation. Extraocular movements are intact. Trigeminal sensation is intact and the muscles of mastication are normal. The face is symmetric. The palate elevates in the midline. Hearing intact. Voice is normal. Shoulder shrug is normal. The tongue has normal motion without fasciculations.   Coordination:    Normal finger to nose and heel to shin.   Gait:    Heel-toe and tandem gait are normal.   Motor Observation:    No asymmetry, no atrophy, and no involuntary movements noted. Tone:    Normal muscle tone.    Posture:    Posture is normal. normal erect    Strength:    Strength is V/V in the upper and lower limbs.      Sensation: intact to LT     Reflex Exam:  DTR's:    Deep tendon reflexes in the upper and lower extremities are brisk bilaterally.   Toes:    The toes are equivocal bilaterally.   Clonus:    Clonus is absent.       Assessment/Plan:  72 year old female here with her husband, reporting progressive memory loss. She has a Flat affect, psychomotor slowing, extensive family history of mood disorder. She denies any depression or mood disorder. May be Alzheimer dementia vs pseudodementia.  Patient denies Alzheimer's disease in first-degree relatives today however when reviewing chart she had reported her father had memory problems when she saw [facility] neurology.  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  Follow up with Ward Givens in 6 months    Cc: Dr. Simmie Davies, [facility]  [address] [place], [place] [address]  Phone [phone] Fax [phone]  A total of 30 minutes was spent in with this patient. Over half this time was spent on counseling patient on the dementia diagnosis and different therapeutic options available.

## 2017-10-09 NOTE — Patient Instructions (Addendum)
Start Nemenda(Memantine) 5mg  twice daily In several months please email and we can increase it to 10mg  twice daily Consider anti-depressant (SSRI) follow up with primary care Follow up in 6 months   Alzheimer Disease Alzheimer disease is a brain disease that affects memory, thinking, and behavior. People with Alzheimer disease lose mental abilities, and the disease gets worse over time. Survival with Alzheimer disease ranges from several years to as long as 20 years. What are the causes? This condition develops when a protein called beta-amyloid forms deposits in the brain. It is not known what causes these deposits to form. What increases the risk? This condition is more likely to develop in people who:  Are elderly.  Have a family history of dementia.  Have had a brain injury.  Have heart or blood vessel disease.  Have had a stroke.  Have high blood pressure or high cholesterol.  Have diabetes.  What are the signs or symptoms? Symptoms of this condition happen in three stages, which often overlap. Early stage In this stage, you may continue to be independent. You may still be able to drive, work, and be social. Symptoms in this stage include:  Minor memory problems, such as forgetting a name or what you read.  Difficulty with: ? Paying attention. ? Communicating. ? Doing familiar tasks. ? Learning new things.  Needing more time to do daily activities.  Anxiety.  Social withdrawal.  Loss of motivation.  Moderate stage In this stage, you will start to need care. This stage usually lasts the longest. Symptoms in this stage include:  Difficulty with expressing thoughts.  Memory loss that affects daily life. This can include forgetting: ? Your address or phone number. ? Events that have happened. ? Parts of your personal history, like where you went to school.  Confusion about where you are or what time it is.  Difficulty in judging distance.  Changes in  personality, mood, and behavior. You may be moody, irritable, angry, frustrated, fearful, anxious, or suspicious.  Poor reasoning and judgment.  Delusions or hallucinations.  Changes in sleep patterns.  Wandering and getting lost.  Severe stage In the final stage, you will need help with your personal care and dailyactivities. Symptoms in this stage include:  Worsening memory loss.  Personality changes.  Loss of awareness of your surroundings.  Changes in physical abilities, including the ability to walk, sit, and swallow.  Difficulty in communicating.  Inability to control the bladder and bowels.  Increasing confusion.  Increasing disruptive behavior.  How is this diagnosed? This condition is diagnosed with an assessment by your health care provider. During this assessment, your health care provider will talk with you and your family, friends, or caregivers about your symptoms. A thorough medical history will be taken, and you will have a physical exam and tests. Tests may include:  Lab tests, such as blood or urine tests.  Imaging tests, such as a CT scan, PET scan, or MRI.  A lumbar puncture. This test involves removing and testing a small amount of the fluid that surrounds the brain and spinal cord.  An electroencephalogram (EEG). In this test, small metal discs are used to measure electrical activity in the brain.  Memory tests, cognitive tests, and neuropsychological tests. These tests evaluate brain function.  How is this treated? At this time, there is no treatment to cure Alzheimer disease or stop it from getting worse. The goals of treatment are:  To slow down the disease.  To manage behavioral  problems.  To provide you with a safe environment.  To make life easier for you and your caregivers.  The following treatment options are available:  Medicines. Medicines may help to slow down memory loss and control behavioral symptoms.  Talk therapy. Talk  therapy provides you with education, support, and memory aids. It is most helpful in the early stages of the condition.  Counseling or spiritual guidance. It is normal to have a lot of feelings, including anger, relief, fear, and isolation. Counseling and guidance can help you deal with these feelings.  Caregiving. This involves having caregivers help you with your daily activities. Caregivers may be family members, friends, or trained medical professionals. Caregiving can be done at home or outside the home.  Family support groups. These provide education, emotional support, and information about community resources to family members who are taking care of you.  Follow these instructions at home: Medicines  Take over-the-counter and prescription medicines only as told by your health care provider.  Avoid taking medicines that can affect thinking, such as pain or sleeping medicines. Lifestyle   Make healthy lifestyle choices: ? Be physically active as told by your health care provider. ? Do not use any tobacco products, such as cigarettes, chewing tobacco, and e-cigarettes. If you need help quitting, ask your health care provider. ? Eat a healthy diet. ? Practice stress-management techniques when you get stressed. ? Stay social.  Drink enough fluid to keep your urine clear or pale yellow.  Make sure to get quality sleep. These tips can help you get a good night's rest: ? Avoid napping during the day. ? Keep your sleeping area dark and cool. ? Avoid exercising during the few hours before you go to bed. ? Avoid caffeine products in the evening. General instructions  Work with your health care provider to determine what you need help with and what your safety needs are.  If you were given a bracelet that tracks your location, make sure to wear it.  Keep all follow-up visits as told by your health care provider. This is important.  If you have questions or would like additional  support, you may contact The Alzheimer's Association: ? 24-hour helpline: [phone] ? Website: [employer] Contact a health care provider if:  You have nausea, vomiting, or trouble with eating.  You have dizziness, or weakness.  You have new or worsening trouble with sleeping.  You or your family members become concerned for your safety. Get help right away if:  You develop chest pain or difficulty with breathing.  You pass out. This information is not intended to replace advice given to you by your health care provider. Make sure you discuss any questions you have with your health care provider. Document Released: 05/15/2004 Document Revised: 05/04/2016 Document Reviewed: 06/01/2015 Elsevier Interactive Patient Education  2018 [employer].  Memantine Tablets What is this medicine? MEMANTINE (MEM an teen) is used to treat dementia caused by Alzheimer's disease. This medicine may be used for other purposes; ask your health care provider or pharmacist if you have questions. COMMON BRAND NAME(S): Namenda What should I tell my health care provider before I take this medicine? They need to know if you have any of these conditions: -difficulty passing urine -kidney disease -liver disease -seizures -an unusual or allergic reaction to memantine, other medicines, foods, dyes, or preservatives -pregnant or trying to get pregnant -breast-feeding How should I use this medicine? Take this medicine by mouth with a glass of water. Follow the  directions on the prescription label. You may take this medicine with or without food. Take your doses at regular intervals. Do not take your medicine more often than directed. Continue to take your medicine even if you feel better. Do not stop taking except on the advice of your doctor or health care professional. Talk to your pediatrician regarding the use of this medicine in children. Special care may be needed. Overdosage: If you think you have  taken too much of this medicine contact a poison control center or emergency room at once. NOTE: This medicine is only for you. Do not share this medicine with others. What if I miss a dose? If you miss a dose, take it as soon as you can. If it is almost time for your next dose, take only that dose. Do not take double or extra doses. If you do not take your medicine for several days, contact your health care provider. Your dose may need to be changed. What may interact with this medicine? -acetazolamide -amantadine -cimetidine -dextromethorphan -dofetilide -hydrochlorothiazide -ketamine -metformin -methazolamide -quinidine -ranitidine -sodium bicarbonate -triamterene This list may not describe all possible interactions. Give your health care provider a list of all the medicines, herbs, non-prescription drugs, or dietary supplements you use. Also tell them if you smoke, drink alcohol, or use illegal drugs. Some items may interact with your medicine. What should I watch for while using this medicine? Visit your doctor or health care professional for regular checks on your progress. Check with your doctor or health care professional if there is no improvement in your symptoms or if they get worse. You may get drowsy or dizzy. Do not drive, use machinery, or do anything that needs mental alertness until you know how this drug affects you. Do not stand or sit up quickly, especially if you are an older patient. This reduces the risk of dizzy or fainting spells. Alcohol can make you more drowsy and dizzy. Avoid alcoholic drinks. What side effects may I notice from receiving this medicine? Side effects that you should report to your doctor or health care professional as soon as possible: -allergic reactions like skin rash, itching or hives, swelling of the face, lips, or tongue -agitation or a feeling of restlessness -depressed mood -dizziness -hallucinations -redness, blistering, peeling or  loosening of the skin, including inside the mouth -seizures -vomiting Side effects that usually do not require medical attention (report to your doctor or health care professional if they continue or are bothersome): -constipation -diarrhea -headache -nausea -trouble sleeping This list may not describe all possible side effects. Call your doctor for medical advice about side effects. You may report side effects to FDA at 1-800-FDA-1088. Where should I keep my medicine? Keep out of the reach of children. Store at room temperature between 15 degrees and 30 degrees C (59 degrees and 86 degrees F). Throw away any unused medicine after the expiration date. NOTE: This sheet is a summary. It may not cover all possible information. If you have questions about this medicine, talk to your doctor, pharmacist, or health care provider.  2018 Elsevier/Gold Standard (2013-06-22 14:10:42)

## 2017-10-10 ENCOUNTER — Encounter: Payer: Self-pay | Admitting: Neurology

## 2017-10-14 DIAGNOSIS — F32A Depression, unspecified: Secondary | ICD-10-CM | POA: Insufficient documentation

## 2017-10-14 DIAGNOSIS — F0280 Dementia in other diseases classified elsewhere without behavioral disturbance: Secondary | ICD-10-CM | POA: Insufficient documentation

## 2017-10-14 DIAGNOSIS — G301 Alzheimer's disease with late onset: Secondary | ICD-10-CM

## 2017-10-14 DIAGNOSIS — F329 Major depressive disorder, single episode, unspecified: Secondary | ICD-10-CM

## 2017-10-24 ENCOUNTER — Other Ambulatory Visit: Payer: Self-pay | Admitting: Certified Nurse Midwife

## 2017-10-24 ENCOUNTER — Ambulatory Visit
Admission: RE | Admit: 2017-10-24 | Discharge: 2017-10-24 | Disposition: A | Discharge status: Home / Self Care | Payer: Medicare Other | Source: Ambulatory Visit | Attending: Certified Nurse Midwife | Admitting: Certified Nurse Midwife

## 2017-10-24 DIAGNOSIS — Z1231 Encounter for screening mammogram for malignant neoplasm of breast: Secondary | ICD-10-CM

## 2017-11-07 DIAGNOSIS — R419 Unspecified symptoms and signs involving cognitive functions and awareness: Secondary | ICD-10-CM | POA: Diagnosis not present

## 2017-11-07 DIAGNOSIS — E785 Hyperlipidemia, unspecified: Secondary | ICD-10-CM | POA: Diagnosis not present

## 2017-11-07 DIAGNOSIS — Z1589 Genetic susceptibility to other disease: Secondary | ICD-10-CM | POA: Diagnosis not present

## 2017-11-07 DIAGNOSIS — R799 Abnormal finding of blood chemistry, unspecified: Secondary | ICD-10-CM | POA: Diagnosis not present

## 2017-11-07 DIAGNOSIS — N951 Menopausal and female climacteric states: Secondary | ICD-10-CM | POA: Diagnosis not present

## 2017-11-07 DIAGNOSIS — E039 Hypothyroidism, unspecified: Secondary | ICD-10-CM | POA: Diagnosis not present

## 2017-11-21 ENCOUNTER — Encounter: Payer: Self-pay | Admitting: Neurology

## 2017-11-21 DIAGNOSIS — R413 Other amnesia: Secondary | ICD-10-CM

## 2017-11-21 MED ORDER — DONEPEZIL HCL 10 MG PO TABS: 10.0000 mg | ORAL_TABLET | Freq: Every day | ORAL | 1 refills | Status: DC

## 2017-11-21 MED ORDER — MEMANTINE HCL 5 MG PO TABS: 5.0000 mg | ORAL_TABLET | Freq: Two times a day (BID) | ORAL | 1 refills | Status: DC

## 2017-11-21 MED ORDER — MEMANTINE HCL 5 MG PO TABS: 5.0000 mg | ORAL_TABLET | Freq: Two times a day (BID) | ORAL | 3 refills | Status: DC

## 2017-11-21 MED ORDER — DONEPEZIL HCL 10 MG PO TABS: 10.0000 mg | ORAL_TABLET | Freq: Every day | ORAL | 3 refills | Status: DC

## 2017-11-29 DIAGNOSIS — R899 Unspecified abnormal finding in specimens from other organs, systems and tissues: Secondary | ICD-10-CM | POA: Diagnosis not present

## 2017-11-29 DIAGNOSIS — R944 Abnormal results of kidney function studies: Secondary | ICD-10-CM | POA: Diagnosis not present

## 2017-12-02 DIAGNOSIS — D899 Disorder involving the immune mechanism, unspecified: Secondary | ICD-10-CM | POA: Diagnosis not present

## 2017-12-02 DIAGNOSIS — R799 Abnormal finding of blood chemistry, unspecified: Secondary | ICD-10-CM | POA: Diagnosis not present

## 2017-12-02 DIAGNOSIS — Z1589 Genetic susceptibility to other disease: Secondary | ICD-10-CM | POA: Diagnosis not present

## 2017-12-02 DIAGNOSIS — E063 Autoimmune thyroiditis: Secondary | ICD-10-CM | POA: Diagnosis not present

## 2017-12-02 DIAGNOSIS — F0280 Dementia in other diseases classified elsewhere without behavioral disturbance: Secondary | ICD-10-CM | POA: Diagnosis not present

## 2017-12-03 ENCOUNTER — Other Ambulatory Visit: Payer: Self-pay | Admitting: Family Medicine

## 2017-12-03 DIAGNOSIS — R899 Unspecified abnormal finding in specimens from other organs, systems and tissues: Secondary | ICD-10-CM

## 2017-12-05 ENCOUNTER — Ambulatory Visit
Admission: RE | Admit: 2017-12-05 | Discharge: 2017-12-05 | Disposition: A | Discharge status: Home / Self Care | Payer: Medicare Other | Source: Ambulatory Visit | Attending: Family Medicine | Admitting: Family Medicine

## 2017-12-05 DIAGNOSIS — R109 Unspecified abdominal pain: Secondary | ICD-10-CM | POA: Diagnosis not present

## 2017-12-05 DIAGNOSIS — R899 Unspecified abnormal finding in specimens from other organs, systems and tissues: Secondary | ICD-10-CM

## 2017-12-05 MED ORDER — IOPAMIDOL (ISOVUE-300) INJECTION 61%
100.0000 mL | Freq: Once | INTRAVENOUS | Status: AC | PRN
  Administered 2017-12-05: 100 mL via INTRAVENOUS

## 2017-12-13 ENCOUNTER — Other Ambulatory Visit: Payer: Medicare Other

## 2017-12-16 ENCOUNTER — Encounter: Payer: Medicare Other | Admitting: Psychology

## 2017-12-31 ENCOUNTER — Ambulatory Visit: Payer: Self-pay | Admitting: Adult Health

## 2018-02-05 DIAGNOSIS — Z1589 Genetic susceptibility to other disease: Secondary | ICD-10-CM | POA: Diagnosis not present

## 2018-02-05 DIAGNOSIS — R419 Unspecified symptoms and signs involving cognitive functions and awareness: Secondary | ICD-10-CM | POA: Diagnosis not present

## 2018-02-05 DIAGNOSIS — N951 Menopausal and female climacteric states: Secondary | ICD-10-CM | POA: Diagnosis not present

## 2018-02-05 DIAGNOSIS — E039 Hypothyroidism, unspecified: Secondary | ICD-10-CM | POA: Diagnosis not present

## 2018-02-05 DIAGNOSIS — E785 Hyperlipidemia, unspecified: Secondary | ICD-10-CM | POA: Diagnosis not present

## 2018-02-05 DIAGNOSIS — R799 Abnormal finding of blood chemistry, unspecified: Secondary | ICD-10-CM | POA: Diagnosis not present

## 2018-02-14 DIAGNOSIS — F0280 Dementia in other diseases classified elsewhere without behavioral disturbance: Secondary | ICD-10-CM | POA: Diagnosis not present

## 2018-02-14 DIAGNOSIS — E785 Hyperlipidemia, unspecified: Secondary | ICD-10-CM | POA: Diagnosis not present

## 2018-02-14 DIAGNOSIS — Z79899 Other long term (current) drug therapy: Secondary | ICD-10-CM | POA: Diagnosis not present

## 2018-02-14 DIAGNOSIS — Z86010 Personal history of colonic polyps: Secondary | ICD-10-CM | POA: Diagnosis not present

## 2018-02-14 DIAGNOSIS — E039 Hypothyroidism, unspecified: Secondary | ICD-10-CM | POA: Diagnosis not present

## 2018-02-14 DIAGNOSIS — Z1331 Encounter for screening for depression: Secondary | ICD-10-CM | POA: Diagnosis not present

## 2018-02-14 DIAGNOSIS — Z0000 Encounter for general adult medical examination without abnormal findings: Secondary | ICD-10-CM | POA: Diagnosis not present

## 2018-02-14 DIAGNOSIS — G301 Alzheimer's disease with late onset: Secondary | ICD-10-CM | POA: Diagnosis not present

## 2018-04-14 ENCOUNTER — Ambulatory Visit: Payer: Medicare Other | Admitting: Adult Health

## 2018-06-11 DIAGNOSIS — Z1589 Genetic susceptibility to other disease: Secondary | ICD-10-CM | POA: Diagnosis not present

## 2018-06-11 DIAGNOSIS — N951 Menopausal and female climacteric states: Secondary | ICD-10-CM | POA: Diagnosis not present

## 2018-06-11 DIAGNOSIS — E785 Hyperlipidemia, unspecified: Secondary | ICD-10-CM | POA: Diagnosis not present

## 2018-06-11 DIAGNOSIS — R419 Unspecified symptoms and signs involving cognitive functions and awareness: Secondary | ICD-10-CM | POA: Diagnosis not present

## 2018-06-11 DIAGNOSIS — E039 Hypothyroidism, unspecified: Secondary | ICD-10-CM | POA: Diagnosis not present

## 2018-06-11 DIAGNOSIS — R799 Abnormal finding of blood chemistry, unspecified: Secondary | ICD-10-CM | POA: Diagnosis not present

## 2018-06-17 ENCOUNTER — Ambulatory Visit (INDEPENDENT_AMBULATORY_CARE_PROVIDER_SITE_OTHER): Payer: Medicare Other | Admitting: Adult Health

## 2018-06-17 ENCOUNTER — Encounter: Payer: Self-pay | Admitting: Adult Health

## 2018-06-17 VITALS — BP 110/73 | HR 59 | Ht 64.0 in | Wt 111.0 lb

## 2018-06-17 DIAGNOSIS — G301 Alzheimer's disease with late onset: Secondary | ICD-10-CM | POA: Diagnosis not present

## 2018-06-17 DIAGNOSIS — F0280 Dementia in other diseases classified elsewhere without behavioral disturbance: Secondary | ICD-10-CM | POA: Diagnosis not present

## 2018-06-17 MED ORDER — MEMANTINE HCL 10 MG PO TABS: 10.0000 mg | ORAL_TABLET | Freq: Two times a day (BID) | ORAL | 3 refills | Status: DC

## 2018-06-17 NOTE — Patient Instructions (Signed)
Your Plan:  Continue Aricept Increase Namenda 10 mg twice a day Memory score slightly declined If your symptoms worsen or you develop new symptoms please let us know.   Thank you for coming to see [place] at [facility]. I hope we have been able to provide you high quality care today.  You may receive a patient satisfaction survey over the next few weeks. We would appreciate your feedback and comments so that we may continue to improve ourselves and the health of our patients.

## 2018-06-17 NOTE — Progress Notes (Signed)
Made any corrections needed, and agree with history, physical, neuro exam,assessment and plan as stated above.     Quinzell Malcomb, MD [facility] 

## 2018-06-17 NOTE — Progress Notes (Signed)
PATIENT: Meagan Schwartz DOB: [birth year]  REASON FOR VISIT: follow up HISTORY FROM: patient  HISTORY OF PRESENT ILLNESS: Today 06/17/18:  Meagan Schwartz is a 72 year old female with a history of Alzheimer's disease.  She returns today for follow-up.  She is currently on Aricept and Namenda.  She continues with her husband.  She is able to complete all ADLs and the family.  She does not operate a motor vehicle.  Denies any trouble with her sleep.  Reports good appetite.  She does not do much cooking.  But rather makes simple things such as a salad.  Her husband manages their finances.  He also manages her medications and appointments.  She returns today for evaluation.  HISTORY:   Interval history: Patient and husband return for follow up, formal neurocognitive testing confirmed moderate dementia. MRI of the brain c/w alzheimer's pathology.  Reviewed MRI images with patient and husband.  Also discussed dementia, and discussed her formal neurocognitive testing today.  Discussed medications that we use to slow down progression of dementia.  Also discussed our clinical trials which they are not interested in.   Dr. Bonita Quin: Clinical Impressions:Moderate dementia (rule out Alzheimer's disease). Results of formal neuropsychological testing revealed severe and rather global cognitive deficits.   Recommendations/Plan: Based on the findings of the present evaluation, the following recommendations are offered:  1.The patient requires assistance with all complex ADLs. Her husband is providing this. She is no longer driving. Her husband may wish to consider in-home caregiving assistance for respite. Anthonette Legato also wish to consider moving to a continuing care retirement community if this is feasible for them.  2. She appears to be an appropriate candidate for cholinesterase inhibitor therapy. She is already taking Aricept. The addition of Namenda could be considered. She will speak with Dr.  Jaynee Eagles about this. 3. Her husband/family will benefit from education and support regarding her diagnosis. I provided some written information from the Alzheimer's Association as well as a list of local resources/contacts.     HPI:  Meagan Schwartz is a 72 y.o. female here as a referral from Dr. Rex Kras for memory loss. PMHx thyroid disease, HLD, arthritis. Memory changes started 2 years ago, started with losing objects and significantly progressing. Husband is here and provides much information. She doesn't drive, she fel uncomfortable "mechanically" driving the car, she has been driving all her life, she was exercising every day and puzzles and that has stopped. Significant history of depression in family, mother and daughter and others. Husband provides most information. She says it is more a lack of interest as opposed to depression but she does get upset when she loses something. She socializes with neighbor and goes to lunch, that is becoming more infrequent. Husband pays the bills, she has never paid the bills. She cannot keep track of the day or date, she usually knows it is 2018, She has to be told things several times like time of appointment, she will repeat stories that she has talked about several times with family but doesn't do that with husband. Grandfather or Grandmother may have had Alzheimers, unsure. Parents passed at the age of 68 wihtout memory problems. Husband thinks they have had B12 labs. Denies significant etoh use.  Reviewed notes, labs and imaging from outside physicians, which showed:  Reviewed notes from neurology in June 2018 where she was seen for dizziness and a possible syncopal episode.  No history of seizures.  No recent changes in medications.  Patient reported  she had been driving that day, she is never smoked, she does not drink alcohol, she is never used smokeless tobacco.  She reported occasional short-term memory complaints, and a family history of memory  problems in her father and several relations who had Alzheimer's disease.  Son-in-law noted that she wrote their names wrong and transpose numbers on recent card she sent them  REVIEW OF SYSTEMS: Out of a complete 14 system review of symptoms, the patient complains only of the following symptoms, and all other reviewed systems are negative.  See HPI  ALLERGIES: Allergies  Allergen Reactions  . Clindamycin     HOME MEDICATIONS: Outpatient Medications Prior to Visit  Medication Sig Dispense Refill  . Calcium Carbonate-Vit D-Min (CALCIUM 1200) 1200-1000 MG-UNIT CHEW Chew 1 capsule by mouth daily.    . Cholecalciferol (VITAMIN D-3) 1000 UNITS CAPS Take 1,000 Units by mouth daily.    Marland Kitchen donepezil (ARICEPT) 10 MG tablet Take 1 tablet (10 mg total) by mouth at bedtime. 90 tablet 3  . estradiol (ESTRACE) 0.1 MG/GM vaginal cream Use 1/2 g vaginally twice weekly 42.5 g 3  . levothyroxine (SYNTHROID, LEVOTHROID) 75 MCG tablet Take 1 tablet by mouth daily.    . memantine (NAMENDA) 5 MG tablet Take 1 tablet (5 mg total) by mouth 2 (two) times daily. 180 tablet 3  . Multiple Vitamin (MULTIVITAMIN) tablet Take 1 tablet by mouth daily.    . multivitamin-lutein (OCUVITE-LUTEIN) CAPS capsule Take 1 capsule by mouth daily.    . Omega-3 Krill Oil 500 MG CAPS     . Red Yeast Rice 600 MG CAPS Take 600 mg by mouth daily.     No facility-administered medications prior to visit.     PAST MEDICAL HISTORY: Past Medical History:  Diagnosis Date  . Arthritis   . Hx of colonic polyps 12/27/2014  . Hyperlipidemia   . 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 Vitamin D deficiency     PAST SURGICAL HISTORY: Past Surgical History:  Procedure Laterality Date  . BREAST BIOPSY Left 2016   benign  . COLONOSCOPY  10-04-2009  . VAGINAL DELIVERY     x2    FAMILY HISTORY: Family History  Problem Relation Age of Onset  . Colon cancer Mother   . Heart disease Mother   . Alzheimer's disease Father   .  Heart disease Father   . Leukemia Brother   . Breast cancer Maternal Grandmother   . Breast cancer Paternal Grandmother   . Paget's disease of bone Cousin   . Paget's disease of bone Cousin   . Polycythemia Brother   . Graves' disease Daughter   . Graves' disease Son   . Rectal cancer Neg Hx   . Stomach cancer Neg Hx     SOCIAL HISTORY: Social History   Socioeconomic History  . Marital status: Married    Spouse name: [name]  . Number of children: 2  . Years of education: Not on file  . Highest education level: Bachelor's degree (e.g., BA, AB, BS)  Occupational History  . Occupation: retired  Scientific laboratory technician  . Financial resource strain: Not on file  . Food insecurity:    Worry: Not on file    Inability: Not on file  . Transportation needs:    Medical: Not on file    Non-medical: Not on file  Tobacco Use  . Smoking status: Former Research scientist (life sciences)  . Smokeless tobacco: Never Used  . Tobacco comment: smoked while in college  Substance and Sexual Activity  . Alcohol use: Yes    Alcohol/week: 7.0 standard drinks    Types: 7 Glasses of wine per week  . Drug use: No  . Sexual activity: Never    Partners: Male    Birth control/protection: Post-menopausal  Lifestyle  . Physical activity:    Days per week: Not on file    Minutes per session: Not on file  . Stress: Not on file  Relationships  . Social connections:    Talks on phone: Not on file    Gets together: Not on file    Attends religious service: Not on file    Active member of club or organization: Not on file    Attends meetings of clubs or organizations: Not on file    Relationship status: Not on file  . Intimate partner violence:    Fear of current or ex partner: Not on file    Emotionally abused: Not on file    Physically abused: Not on file    Forced sexual activity: Not on file  Other Topics Concern  . Not on file  Social History Narrative   Lives at home with her husband and two dogs   Right handed   About 1  cup of tea every 2 weeks       PHYSICAL EXAM  Vitals:   06/17/18 1414  BP: 110/73  Pulse: (!) 59  Weight: 111 lb (50.3 kg)  Height: 5\' 4"  (1.626 m)   Body mass index is 19.05 kg/m.  MMSE - Mini Mental State Exam 06/17/2018 08/22/2017  Not completed: (No Data) -  Orientation to time 3 2  Orientation to Place 2 3  Registration 3 3  Attention/ Calculation 1 2  Recall 0 0  Language- name 2 objects 2 2  Language- repeat 1 1  Language- follow 3 step command 2 3  Language- read & follow direction 1 1  Write a sentence 1 1  Copy design 0 0  Total score 16 18     Generalized: Well developed, in no acute distress   Neurological examination  Mentation: Alert oriented to time, place, history taking. Follows all commands speech and language fluent Cranial nerve II-XII: Pupils were equal round reactive to light. Extraocular movements were full, visual field were full on confrontational test. Facial sensation and strength were normal. Uvula tongue midline. Head turning and shoulder shrug  were normal and symmetric. Motor: The motor testing reveals 5 over 5 strength of all 4 extremities. Good symmetric motor tone is noted throughout.  Sensory: Sensory testing is intact to soft touch on all 4 extremities. No evidence of extinction is noted.  Coordination: Cerebellar testing reveals good finger-nose-finger and heel-to-shin bilaterally.  Gait and station: Gait is normal. Reflexes: Deep tendon reflexes are symmetric and normal bilaterally.   DIAGNOSTIC DATA (LABS, IMAGING, TESTING) - I reviewed patient records, labs, notes, testing and imaging myself where available.   ASSESSMENT AND PLAN 72 y.o. year old female  has a past medical history of Arthritis, colonic polyps (12/27/2014), Hyperlipidemia, Thyroid disease, and Vitamin D deficiency. here with:  1.  Alzheimer's disease  The patient's memory score has declined slightly.  She will continue on Aricept 10 mg at bedtime.  I suggested  that we increase Namenda to 10 mg twice a day.  They are amenable to this plan.  I have advised that if her symptoms worsen or she develops new symptoms that should let us know.  She will follow-up  in 6 months or sooner if needed.  I spent 15 minutes with the patient. 50% of this time was spent reviewing her plan of care   Ward Givens, MSN, NP-C 06/17/2018, 2:11 PM [facility] [address] [place], [place] [address] [phone]

## 2018-07-01 DIAGNOSIS — Z23 Encounter for immunization: Secondary | ICD-10-CM | POA: Diagnosis not present

## 2018-07-16 ENCOUNTER — Ambulatory Visit (INDEPENDENT_AMBULATORY_CARE_PROVIDER_SITE_OTHER): Payer: Medicare Other | Admitting: Certified Nurse Midwife

## 2018-07-16 ENCOUNTER — Encounter: Payer: Self-pay | Admitting: Certified Nurse Midwife

## 2018-07-16 ENCOUNTER — Other Ambulatory Visit: Payer: Self-pay

## 2018-07-16 VITALS — BP 130/80 | HR 64 | Resp 16 | Ht 61.75 in | Wt 110.0 lb

## 2018-07-16 DIAGNOSIS — N6320 Unspecified lump in the left breast, unspecified quadrant: Secondary | ICD-10-CM

## 2018-07-16 DIAGNOSIS — Z01411 Encounter for gynecological examination (general) (routine) with abnormal findings: Secondary | ICD-10-CM | POA: Diagnosis not present

## 2018-07-16 DIAGNOSIS — N951 Menopausal and female climacteric states: Secondary | ICD-10-CM

## 2018-07-16 DIAGNOSIS — R599 Enlarged lymph nodes, unspecified: Secondary | ICD-10-CM

## 2018-07-16 NOTE — Progress Notes (Signed)
72 y.o. G92P2002 Married  Caucasian Fe here for annual exam. Post menopausal denies vaginal dryness and vaginal bleeding. Has stopped using Estrace cream, can't remember to use. Denies any urinary incontinence. Patient not aware she has lost weight, states "she eating". Sees PCP yearly she thinks it is soon. Spouse in waiting room and patient feels she is fine to discuss her care. Recent diagnosis of Alzheimer's ( per spouse participating in WF study with diet changes) her weight has stabilized per spouse. Sees PCP for  Vitamin D, hypothyroid management. No other health issues that she is aware of.  Patient's last menstrual period was 09/17/1996 (approximate).          Sexually active: No.  The current method of family planning is post menopausal status.    Exercising: Yes.    walking Smoker:  no  Review of Systems  Constitutional: Negative.   HENT: Negative.   Eyes: Negative.   Respiratory: Negative.   Cardiovascular: Negative.   Gastrointestinal: Negative.   Genitourinary: Negative.   Musculoskeletal: Negative.   Skin: Negative.   Neurological: Negative.   Endo/Heme/Allergies: Negative.   Psychiatric/Behavioral: Negative.     Health Maintenance: Pap:  06-27-17 neg History of Abnormal Pap: no MMG:  10-24-17 category c density birads 1:neg Self Breast exams: no Colonoscopy:  2016 f/u 43yrs BMD:   2016 pcp TDaP:  2017 Shingles: 2019 Pneumonia: maybe with pcp Hep C and HIV: not done Labs: if needed   reports that she has quit smoking. She has never used smokeless tobacco. She reports that she drank alcohol. She reports that she does not use drugs.  Past Medical History:  Diagnosis Date  . Alzheimer disease ([place])   . Arthritis   . Hx of colonic polyps 12/27/2014  . Hyperlipidemia   . 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 Vitamin D deficiency     Past Surgical History:  Procedure Laterality Date  . BREAST BIOPSY Left 2016   benign  . COLONOSCOPY  10-04-2009  .  VAGINAL DELIVERY     x2    Current Outpatient Medications  Medication Sig Dispense Refill  . Calcium Carbonate-Vit D-Min (CALCIUM 1200) 1200-1000 MG-UNIT CHEW Chew 1 capsule by mouth daily.    . Cholecalciferol (VITAMIN D-3) 1000 UNITS CAPS Take 1,000 Units by mouth daily.    . Coenzyme Q10 (CO Q 10 PO) Take 1 tablet by mouth daily.    Marland Kitchen donepezil (ARICEPT) 10 MG tablet Take 1 tablet (10 mg total) by mouth at bedtime. 90 tablet 3  . estradiol (ESTRACE) 0.1 MG/GM vaginal cream Use 1/2 g vaginally twice weekly 42.5 g 3  . levothyroxine (SYNTHROID, LEVOTHROID) 75 MCG tablet Take 1 tablet by mouth daily.    . memantine (NAMENDA) 10 MG tablet Take 1 tablet (10 mg total) by mouth 2 (two) times daily. 180 tablet 3  . Multiple Vitamin (MULTIVITAMIN) tablet Take 1 tablet by mouth daily.    . multivitamin-lutein (OCUVITE-LUTEIN) CAPS capsule Take 1 capsule by mouth daily.    . NON FORMULARY Take 15 mg by mouth 2 (two) times daily. Znzyme    . NON FORMULARY Take 500 mg by mouth daily. NAC    . NON FORMULARY Take 500 mg by mouth 2 (two) times daily. Berbonime HCL    . NON FORMULARY Take 300 mg by mouth 2 (two) times daily. Bio Ashwaghanda    . NON FORMULARY Aqueous Silenum 2 drops a day    . NON FORMULARY Take  20 mg by mouth daily. Pqq    . NON FORMULARY Take 250 mg by mouth 2 (two) times daily. Citicoline    . NON FORMULARY Take 200 mg by mouth 2 (two) times daily. Bacopa    . NON FORMULARY Take 500 mg by mouth daily. [place]    . NON FORMULARY Take 1 tablet by mouth 2 (two) times daily. [place]    . Omega-3 Krill Oil 500 MG CAPS     . Red Yeast Rice 600 MG CAPS Take 600 mg by mouth daily.     No current facility-administered medications for this visit.     Family History  Problem Relation Age of Onset  . Colon cancer Mother   . Heart disease Mother   . Alzheimer's disease Father   . Heart disease Father   . Leukemia Brother   . Breast cancer Maternal Grandmother   . Breast  cancer Paternal Grandmother   . Paget's disease of bone Cousin   . Paget's disease of bone Cousin   . Polycythemia Brother   . Graves' disease Daughter   . Graves' disease Son   . Rectal cancer Neg Hx   . Stomach cancer Neg Hx     ROS:  Pertinent items are noted in HPI.  Otherwise, a comprehensive ROS was negative.  Exam:   BP 130/80   Pulse 64   Resp 16   Ht 5' 1.75" (1.568 m)   Wt 110 lb (49.9 kg)   LMP 09/17/1996 (Approximate)   BMI 20.28 kg/m  Height: 5' 1.75" (156.8 cm) Ht Readings from Last 3 Encounters:  07/16/18 5' 1.75" (1.568 m)  06/17/18 5\' 4"  (1.626 m)  10/09/17 5\' 4"  (1.626 m)    General appearance: alert, cooperative and appears stated age Head: Normocephalic, without obvious abnormality, atraumatic Neck: no adenopathy, supple, symmetrical, trachea midline and thyroid normal to inspection and palpation Lungs: clear to auscultation bilaterally Breasts: normal appearance, no masses or tenderness, No nipple retraction or dimpling, No nipple discharge or bleeding, No axillary or supraclavicular adenopathy, 2-3 cm marble feel mass at 3 o'clock near mid to outer edge of breast Heart: regular rate and rhythm Abdomen: soft, non-tender; no masses,  no organomegaly Extremities: extremities normal, atraumatic, no cyanosis or edema Skin: Skin color, texture, turgor normal. No rashes or lesions Lymph nodes: Cervical, supraclavicular mildly enlarged, and axillary nodes palpate normal. Inguinal nodes palpated mildly enlarged non  tender Neurologic: Grossly normal   Pelvic: External genitalia:  no lesions, normal atrophic female appearance              Urethra:  normal appearing urethra with no masses, tenderness or lesions              Bartholin's and Skene's: normal                 Vagina: normal appearing vagina with normal color and discharge, no lesions              Cervix: no cervical motion tenderness and no lesions              Pap taken: No. Bimanual Exam:   Uterus:  normal size, contour, position, consistency, mobility, non-tender and anteverted              Adnexa: normal adnexa and no mass, fullness, tenderness               Rectovaginal: Confirms  Anus:  normal sphincter tone, no lesions  Chaperone present: yes  A:  Well Woman with normal exam  Left breast mass  Enlarged cervical and inguinal lymph nodes  New diagnosis of Alzheimers participating in Study program with dietary management  Hypothyroid, Vitamin d management with PCP  P:   Reviewed health and wellness pertinent to exam  Discussed with spouse and patient breast mass finding on left and need to have evaluated with [place] and diagnostic mammogram. Patient agreeable.Patient will be scheduled prior to leaving today. Spouse with her.  Discussed enlarged lymph nodes could be viral cause, but will draw CBC with diff to make sure no issues. May want to recheck in 2 weeks all normal. Patient agreeable.  Continue follow up with MD's as indicated  Pap smear: no  counseled on breast self exam, mammography screening, adequate intake of calcium and vitamin D, diet and exercise  return annually or prn  An After Visit Summary was printed and given to the patient.

## 2018-07-16 NOTE — Progress Notes (Signed)
Patient scheduled while in office, spouse present. Scheduled for left breast Dx MMG and [place], if needed, on 07/21/18 at 1:40pm, arriving at 1:20pm at [facility]. Patient and spouse verbalizes understanding and are agreeable to date and time.

## 2018-07-17 LAB — CBC WITH DIFFERENTIAL/PLATELET
Basophils Absolute: 0.1 10*3/uL (ref 0.0–0.2)
Basos: 1 %
EOS (ABSOLUTE): 0.1 10*3/uL (ref 0.0–0.4)
Eos: 1 %
Hematocrit: 39.5 % (ref 34.0–46.6)
Hemoglobin: 13.5 g/dL (ref 11.1–15.9)
Immature Grans (Abs): 0 10*3/uL (ref 0.0–0.1)
Immature Granulocytes: 0 %
Lymphocytes Absolute: 1.1 10*3/uL (ref 0.7–3.1)
Lymphs: 16 %
MCH: 31.3 pg (ref 26.6–33.0)
MCHC: 34.2 g/dL (ref 31.5–35.7)
MCV: 91 fL (ref 79–97)
Monocytes Absolute: 0.7 10*3/uL (ref 0.1–0.9)
Monocytes: 9 %
Neutrophils Absolute: 5.3 10*3/uL (ref 1.4–7.0)
Neutrophils: 73 %
Platelets: 252 10*3/uL (ref 150–450)
RBC: 4.32 x10E6/uL (ref 3.77–5.28)
RDW: 12.4 % (ref 12.3–15.4)
WBC: 7.2 10*3/uL (ref 3.4–10.8)

## 2018-07-21 ENCOUNTER — Ambulatory Visit
Admission: RE | Admit: 2018-07-21 | Discharge: 2018-07-21 | Disposition: A | Discharge status: Home / Self Care | Payer: Medicare Other | Source: Ambulatory Visit | Attending: Certified Nurse Midwife | Admitting: Certified Nurse Midwife

## 2018-07-21 DIAGNOSIS — N6321 Unspecified lump in the left breast, upper outer quadrant: Secondary | ICD-10-CM | POA: Diagnosis not present

## 2018-07-21 DIAGNOSIS — N6320 Unspecified lump in the left breast, unspecified quadrant: Secondary | ICD-10-CM

## 2018-07-21 DIAGNOSIS — R922 Inconclusive mammogram: Secondary | ICD-10-CM | POA: Diagnosis not present

## 2018-07-30 ENCOUNTER — Ambulatory Visit (INDEPENDENT_AMBULATORY_CARE_PROVIDER_SITE_OTHER): Payer: Medicare Other | Admitting: Certified Nurse Midwife

## 2018-07-30 ENCOUNTER — Encounter: Payer: Self-pay | Admitting: Certified Nurse Midwife

## 2018-07-30 ENCOUNTER — Other Ambulatory Visit: Payer: Self-pay

## 2018-07-30 VITALS — BP 124/80 | HR 68 | Resp 16 | Wt 110.0 lb

## 2018-07-30 DIAGNOSIS — Z1239 Encounter for other screening for malignant neoplasm of breast: Secondary | ICD-10-CM

## 2018-07-30 DIAGNOSIS — Z0000 Encounter for general adult medical examination without abnormal findings: Secondary | ICD-10-CM

## 2018-07-30 NOTE — Patient Instructions (Signed)
Breast Self-Awareness Breast self-awareness means:  Knowing how your breasts look.  Knowing how your breasts feel.  Checking your breasts every month for changes.  Telling your doctor if you notice a change in your breasts.  Breast self-awareness allows you to notice a breast problem early while it is still small. How to do a breast self-exam One way to learn what is normal for your breasts and to check for changes is to do a breast self-exam. To do a breast self-exam: Look for Changes  1. Take off all the clothes above your waist. 2. Stand in front of a mirror in a room with good lighting. 3. Put your hands on your hips. 4. Push your hands down. 5. Look at your breasts and nipples in the mirror to see if one breast or nipple looks different than the other. Check to see if: ? The shape of one breast is different. ? The size of one breast is different. ? There are wrinkles, dips, and bumps in one breast and not the other. 6. Look at each breast for changes in your skin, such as: ? Redness. ? Scaly areas. 7. Look for changes in your nipples, such as: ? Liquid around the nipples. ? Bleeding. ? Dimpling. ? Redness. ? A change in where the nipples are. Feel for Changes 1. Lie on your back on the floor. 2. Feel each breast. To do this, follow these steps: ? Pick a breast to feel. ? Put the arm closest to that breast above your head. ? Use your other arm to feel the nipple area of your breast. Feel the area with the pads of your three middle fingers by making small circles with your fingers. For the first circle, press lightly. For the second circle, press harder. For the third circle, press even harder. ? Keep making circles with your fingers at the light, harder, and even harder pressures as you move down your breast. Stop when you feel your ribs. ? Move your fingers a little toward the center of your body. ? Start making circles with your fingers again, this time going up until  you reach your collarbone. ? Keep making up and down circles until you reach your armpit. Remember to keep using the three pressures. ? Feel the other breast in the same way. 3. Sit or stand in the shower or tub. 4. With soapy water on your skin, feel each breast the same way you did in step 2, when you were lying on the floor. Write Down What You Find  After doing the self-exam, write down:  What is normal for each breast.  Any changes you find in each breast.  When you last had your period.  How often should I check my breasts? Check your breasts every month. If you are breastfeeding, the best time to check them is after you feed your baby or after you use a breast pump. If you get periods, the best time to check your breasts is 5-7 days after your period is over. When should I see my doctor? See your doctor if you notice:  A change in shape or size of your breasts or nipples.  A change in the skin of your breast or nipples, such as red or scaly skin.  Unusual fluid coming from your nipples.  A lump or thick area that was not there before.  Pain in your breasts.  Anything that concerns you.  This information is not intended to replace advice given to   you by your health care provider. Make sure you discuss any questions you have with your health care provider. Document Released: 02/20/2008 Document Revised: 02/09/2016 Document Reviewed: 07/24/2015 Elsevier Interactive Patient Education  2018 Elsevier Inc.  

## 2018-07-30 NOTE — Progress Notes (Signed)
Review of Systems  Constitutional: Positive for weight loss.       Not new   HENT: Negative.   Eyes: Negative.   Respiratory: Negative.   Cardiovascular: Negative.   Gastrointestinal: Negative.   Genitourinary: Negative.   Musculoskeletal:       No new changes  Skin: Negative.   Neurological:       No new changes  Endo/Heme/Allergies: Negative.   Psychiatric/Behavioral:       No new changes    72 y.o. Married Caucasian female G2P2002 here for follow up of enlarged lymph nodes noted at aex and follow up breast exam for left breast mass. Spouse with patient due to history of aggressive Alzheimer disease. Patient does her own SBE and does feel the difference between her left and right breast. Denies fever, virus or other changes since last visit. Had sinus issues at last appointment on 07/16/18. No other health concerns today.   Physical Exam  Constitutional: She is oriented to person, place, and time. She appears well-developed.  thin  Pulmonary/Chest: Right breast exhibits no inverted nipple, no mass, no nipple discharge and no tenderness. Left breast exhibits mass. Left breast exhibits no inverted nipple, no nipple discharge, no skin change and no tenderness. No breast tenderness or discharge.  Palpated area agrees with Mammogram finding of breast clip placed with previous biopsy  No other masses  noted    Lymphadenopathy:       Head (right side): No submandibular and no occipital adenopathy present.       Head (left side): No submandibular and no occipital adenopathy present.    She has no cervical adenopathy.       Right cervical: No superficial cervical adenopathy present.      Left cervical: No superficial cervical adenopathy present.    She has no axillary adenopathy.       Right: No inguinal and no supraclavicular adenopathy present.       Left: No inguinal and no supraclavicular adenopathy present.  Areas of palpated slightly enlarged lymph nodes, now palpate normal, non  tender  Neurological: She is alert and oriented to person, place, and time.  Skin: Skin is warm and dry.     A: Normal breast exam with noted finding of breast clip as mass palpated Areas of lymph node enlargement have resolved. CBC with diff was normal    P: Discussed findings of normal exam with breast with explanation for mass noted at US/mammogram. Continue SBE and mammogram yearly. Discussed no enlarged lymph nodes noted today. Etiology possible viral due to sinus issues at last appointment. Questions addressed.  Rv prn   Labs   Instructions given regarding:  RV

## 2018-08-12 ENCOUNTER — Other Ambulatory Visit: Payer: Self-pay | Admitting: *Deleted

## 2018-08-12 ENCOUNTER — Telehealth: Payer: Self-pay | Admitting: *Deleted

## 2018-08-12 DIAGNOSIS — R413 Other amnesia: Secondary | ICD-10-CM

## 2018-08-12 MED ORDER — DONEPEZIL HCL 10 MG PO TABS: 10.0000 mg | ORAL_TABLET | Freq: Every day | ORAL | 1 refills | Status: DC

## 2018-08-12 NOTE — Telephone Encounter (Signed)
error 

## 2018-09-05 IMAGING — MG DIGITAL SCREENING BILATERAL MAMMOGRAM WITH TOMO AND CAD
9 of 12 series · 9 of 28 positions shown · non-contrast
Comparison: Previous exam(s).

CLINICAL DATA: Screening.

EXAM:
DIGITAL SCREENING BILATERAL MAMMOGRAM WITH TOMO AND CAD

[L MLO]
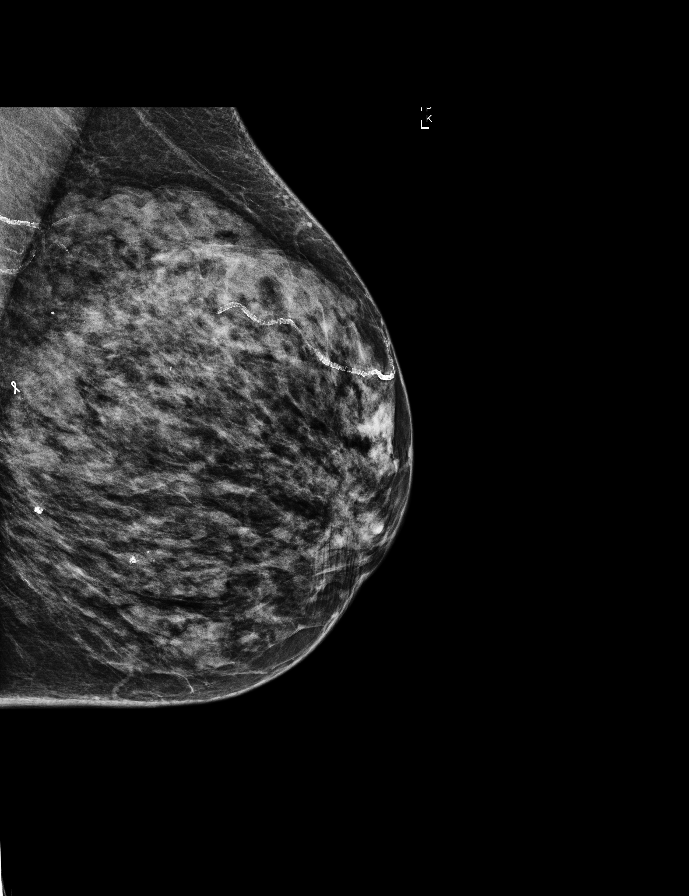

[L CC]
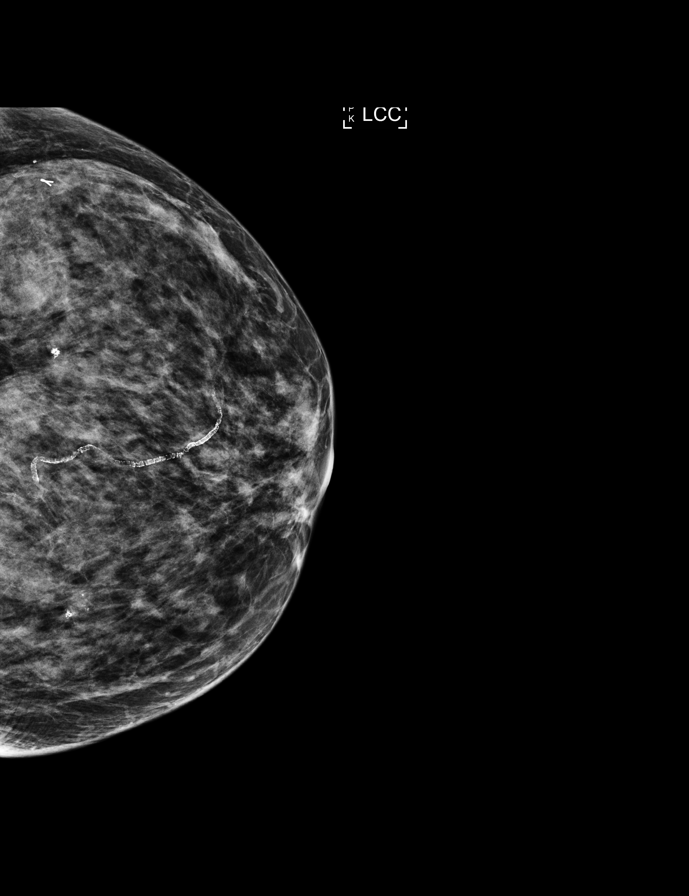

[R CC]
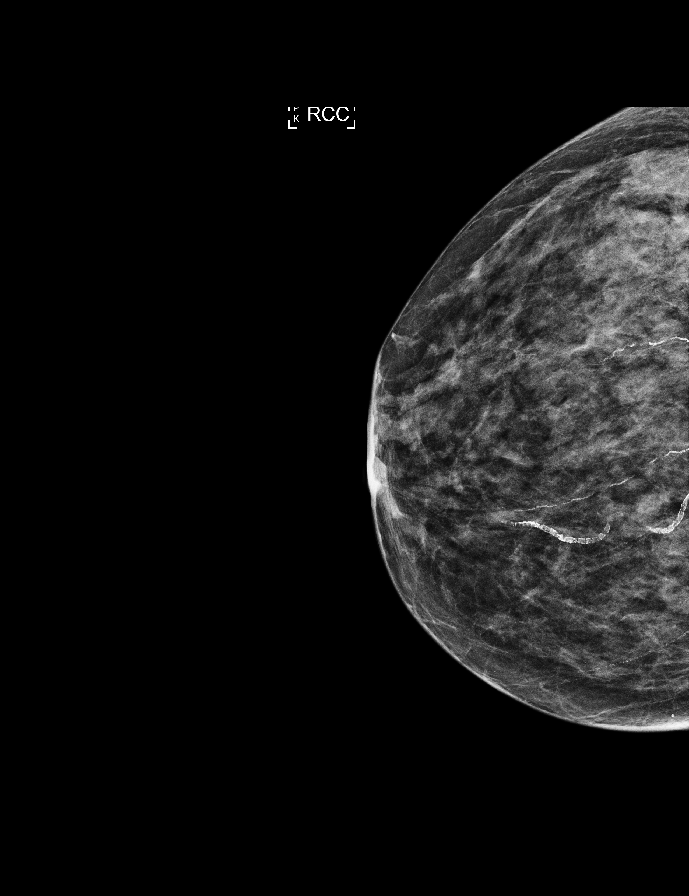

[R MLO]
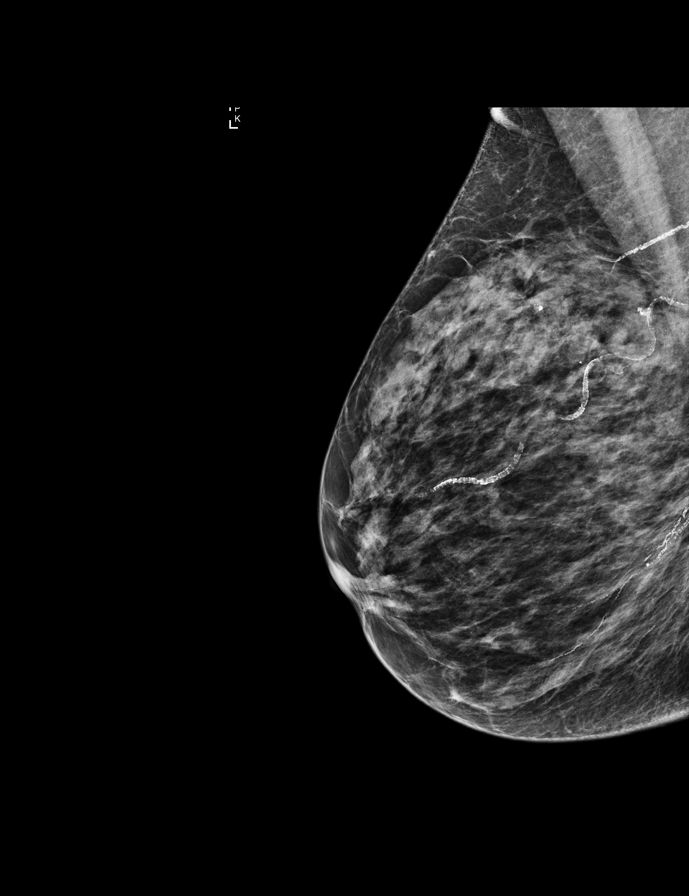

[R MLO synth-2D]
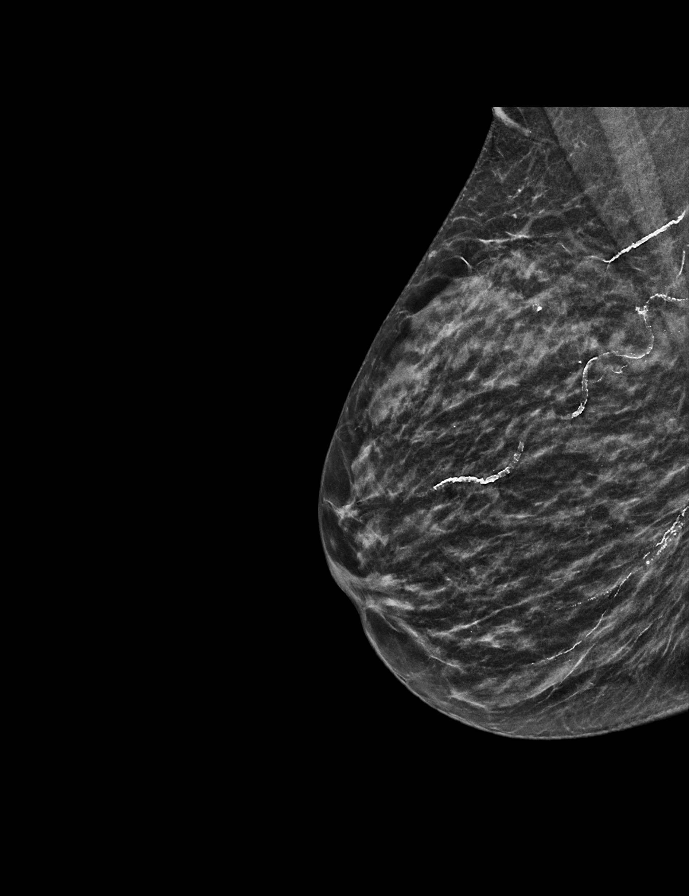

[R CC synth-2D]
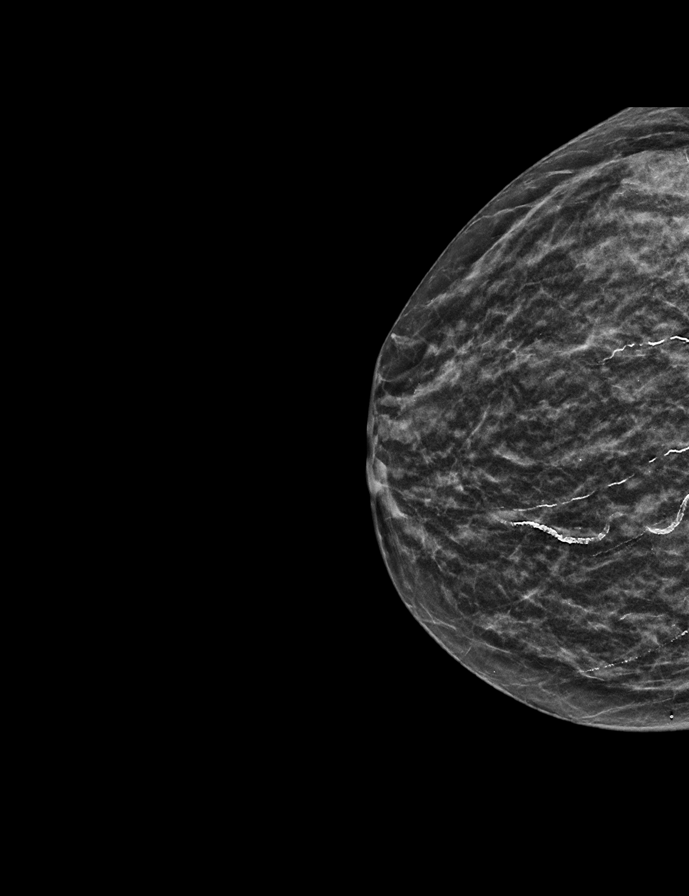

[L MLO synth-2D]
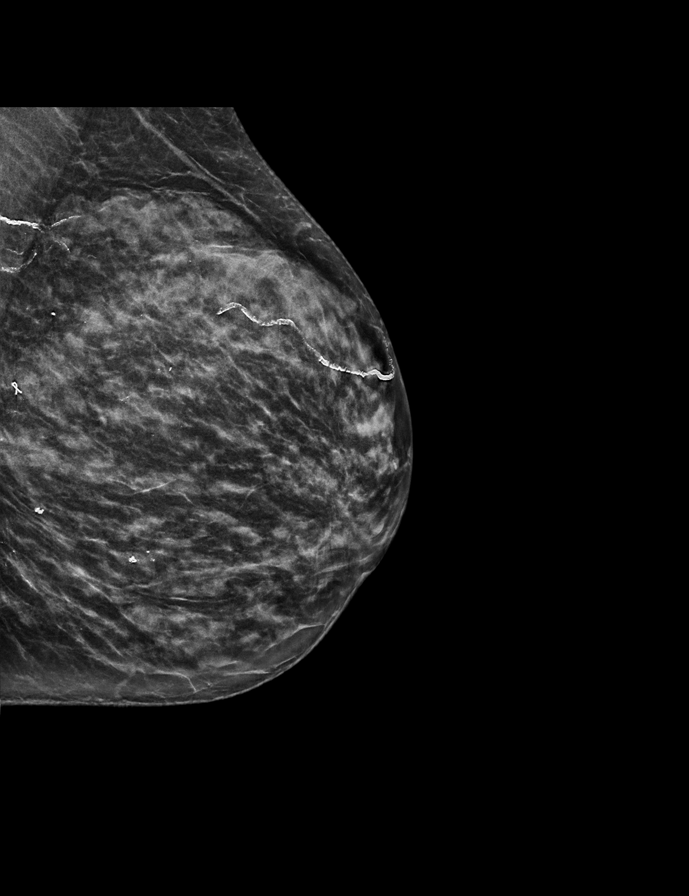

[L CC synth-2D]
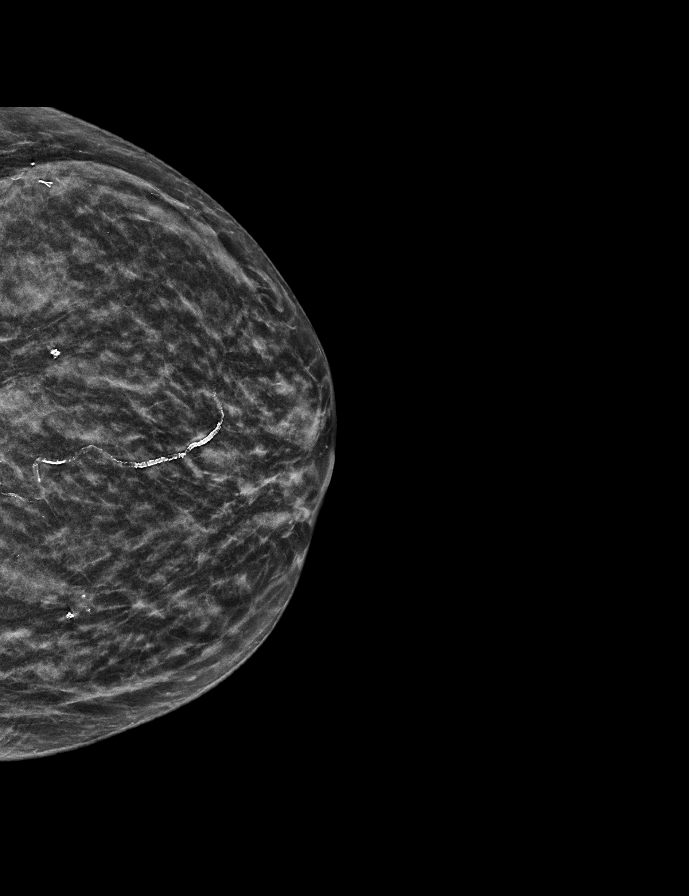

[L MLO tomo · tomo slice 23/46.0]
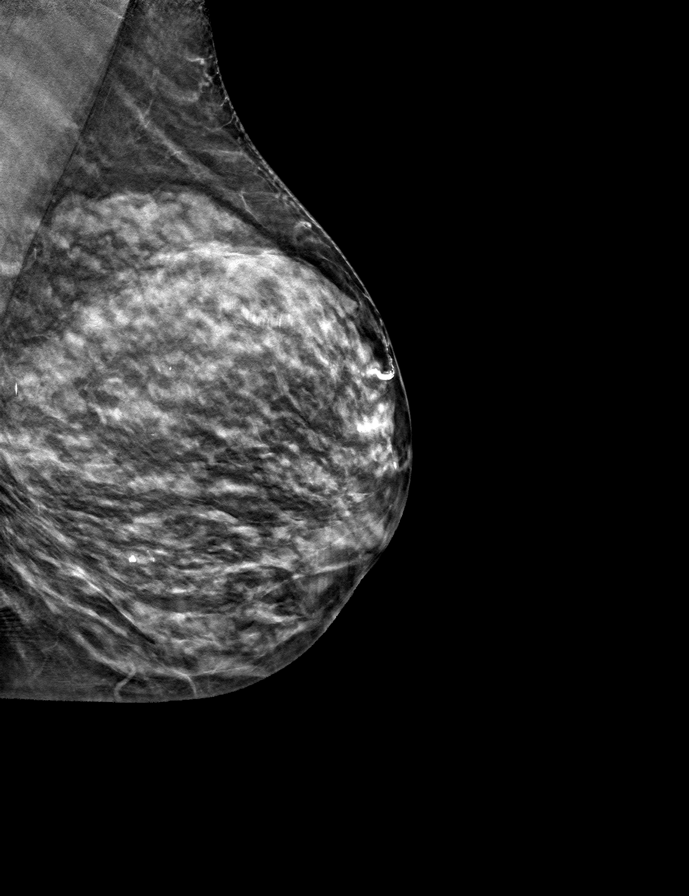

[9 of 28 positions shown; findings below may reference images not displayed]

ACR Breast Density Category c: The breast tissue is heterogeneously
dense, which may obscure small masses.
FINDINGS: There are no findings suspicious for malignancy. Images were
processed with CAD.
IMPRESSION: No mammographic evidence of malignancy. A result letter of this
screening mammogram will be mailed directly to the patient.

RECOMMENDATION:
Screening mammogram in one year. (Code:[id number])

BI-RADS CATEGORY  1: Negative.

## 2018-09-12 DIAGNOSIS — E785 Hyperlipidemia, unspecified: Secondary | ICD-10-CM | POA: Diagnosis not present

## 2018-09-12 DIAGNOSIS — E039 Hypothyroidism, unspecified: Secondary | ICD-10-CM | POA: Diagnosis not present

## 2018-09-12 DIAGNOSIS — R799 Abnormal finding of blood chemistry, unspecified: Secondary | ICD-10-CM | POA: Diagnosis not present

## 2018-09-12 DIAGNOSIS — D899 Disorder involving the immune mechanism, unspecified: Secondary | ICD-10-CM | POA: Diagnosis not present

## 2018-09-25 ENCOUNTER — Other Ambulatory Visit: Payer: Self-pay | Admitting: Certified Nurse Midwife

## 2018-09-25 DIAGNOSIS — Z1231 Encounter for screening mammogram for malignant neoplasm of breast: Secondary | ICD-10-CM

## 2018-10-29 ENCOUNTER — Ambulatory Visit
Admission: RE | Admit: 2018-10-29 | Discharge: 2018-10-29 | Disposition: A | Discharge status: Home / Self Care | Payer: Medicare Other | Source: Ambulatory Visit | Attending: Certified Nurse Midwife | Admitting: Certified Nurse Midwife

## 2018-10-29 DIAGNOSIS — Z1231 Encounter for screening mammogram for malignant neoplasm of breast: Secondary | ICD-10-CM | POA: Diagnosis not present

## 2019-02-10 ENCOUNTER — Telehealth: Payer: Self-pay

## 2019-02-10 NOTE — Telephone Encounter (Signed)
Spoke with the patient and they have given verbal consent to file their insurance and to do a doxy.me visit. E-mail has been confirmed and sent.  E-mail: [email or web address]

## 2019-02-13 ENCOUNTER — Encounter: Payer: Self-pay | Admitting: Family Medicine

## 2019-02-17 ENCOUNTER — Ambulatory Visit: Payer: Medicare Other | Admitting: Adult Health

## 2019-02-17 ENCOUNTER — Other Ambulatory Visit: Payer: Self-pay

## 2019-02-17 ENCOUNTER — Telehealth: Payer: Self-pay | Admitting: Family Medicine

## 2019-02-17 ENCOUNTER — Encounter (INDEPENDENT_AMBULATORY_CARE_PROVIDER_SITE_OTHER): Payer: Self-pay | Admitting: Family Medicine

## 2019-02-17 ENCOUNTER — Encounter: Payer: Self-pay | Admitting: Family Medicine

## 2019-02-17 DIAGNOSIS — Z0289 Encounter for other administrative examinations: Secondary | ICD-10-CM

## 2019-02-17 DIAGNOSIS — F0280 Dementia in other diseases classified elsewhere without behavioral disturbance: Secondary | ICD-10-CM

## 2019-02-17 DIAGNOSIS — G301 Alzheimer's disease with late onset: Secondary | ICD-10-CM

## 2019-02-17 DIAGNOSIS — F32A Depression, unspecified: Secondary | ICD-10-CM

## 2019-02-17 NOTE — Progress Notes (Signed)
  Video conference via doxy.me was attempted with Mr. and Mrs. Fairley.  Unfortunately, patient was unable to engage camera and microphone for doxy.me visit.  I asked for permission to converts visit to a teleconference.  Mr. Chouinard was not in agreement with this option.  He is adamant that he wishes for an in office visit with a full physical exam as well as an MMSE performed.  Mr.Dunkerson was advised that due to COVID 19 we will recommending virtual visit, however, I was happy to schedule Camiyah for an in office visit with me.  He request a visit the first part of October with Vaughan Browner, NP for comparison of last visit, exam and MMSE results. I scheduled Ruby for a follow-up with [name] on October 8 at 2:30 PM.  He reports that Alane is doing well on Aricept and Namenda with no obvious adverse effects.  He does feel that her memory has worsened since her last visit.  He was advised that we would see her in the office for a full physical exam and MMSE.  He verbalizes appreciation.

## 2019-02-17 NOTE — Progress Notes (Signed)
Reviewed note, Sarina Ill MD

## 2019-02-17 NOTE — Telephone Encounter (Signed)
Video conference via doxy.me was attempted with Mr. and Mrs. Kamp.  Unfortunately, patient was unable to engage camera and microphone for doxy.me visit.  I asked for permission to converts visit to a teleconference.  Mr. Geving was not in agreement with this option.  He is adamant that he wishes for an in office visit with a full physical exam as well as an MMSE performed.  Mr.Dahle was advised that due to COVID 19 we will recommending virtual visit, however, I was happy to schedule Pablo for an in office visit with me.  He request a visit the first part of October with Vaughan Browner, NP for comparison of last visit, exam and MMSE results. I scheduled Ruby for a follow-up with [name] on October 8 at 2:30 PM.  He reports that Emmarie is doing well on Aricept and Namenda with no obvious adverse effects.  He does feel that her memory has worsened since her last visit.  He was advised that we would see her in the office for a full physical exam and MMSE.  He verbalizes appreciation.

## 2019-02-21 ENCOUNTER — Other Ambulatory Visit: Payer: Self-pay | Admitting: Neurology

## 2019-02-21 DIAGNOSIS — R413 Other amnesia: Secondary | ICD-10-CM

## 2019-05-30 ENCOUNTER — Other Ambulatory Visit: Payer: Self-pay | Admitting: Neurology

## 2019-05-30 DIAGNOSIS — R413 Other amnesia: Secondary | ICD-10-CM

## 2019-06-18 DIAGNOSIS — E785 Hyperlipidemia, unspecified: Secondary | ICD-10-CM | POA: Diagnosis not present

## 2019-06-18 DIAGNOSIS — Z9181 History of falling: Secondary | ICD-10-CM | POA: Diagnosis not present

## 2019-06-18 DIAGNOSIS — Z1159 Encounter for screening for other viral diseases: Secondary | ICD-10-CM | POA: Diagnosis not present

## 2019-06-18 DIAGNOSIS — Z0000 Encounter for general adult medical examination without abnormal findings: Secondary | ICD-10-CM | POA: Diagnosis not present

## 2019-06-18 DIAGNOSIS — Z23 Encounter for immunization: Secondary | ICD-10-CM | POA: Diagnosis not present

## 2019-06-18 DIAGNOSIS — G301 Alzheimer's disease with late onset: Secondary | ICD-10-CM | POA: Diagnosis not present

## 2019-06-18 DIAGNOSIS — Z86010 Personal history of colonic polyps: Secondary | ICD-10-CM | POA: Diagnosis not present

## 2019-06-18 DIAGNOSIS — Z79899 Other long term (current) drug therapy: Secondary | ICD-10-CM | POA: Diagnosis not present

## 2019-06-18 DIAGNOSIS — E039 Hypothyroidism, unspecified: Secondary | ICD-10-CM | POA: Diagnosis not present

## 2019-06-23 ENCOUNTER — Telehealth: Payer: Self-pay | Admitting: Certified Nurse Midwife

## 2019-06-23 NOTE — Telephone Encounter (Signed)
Patient's husband, Annie Main called and states patient had annual exam with PCP on 10/1. Husband has questions for Debbi about PCP's recommendations for GYN exam due to patient's current state of dementia. Would like a return call from Debbi.

## 2019-06-25 ENCOUNTER — Other Ambulatory Visit: Payer: Self-pay

## 2019-06-25 ENCOUNTER — Encounter: Payer: Self-pay | Admitting: Adult Health

## 2019-06-25 ENCOUNTER — Ambulatory Visit (INDEPENDENT_AMBULATORY_CARE_PROVIDER_SITE_OTHER): Payer: Medicare Other | Admitting: Adult Health

## 2019-06-25 VITALS — BP 159/82 | HR 52 | Temp 97.4°F | Ht 62.0 in | Wt 113.8 lb

## 2019-06-25 DIAGNOSIS — F0280 Dementia in other diseases classified elsewhere without behavioral disturbance: Secondary | ICD-10-CM | POA: Diagnosis not present

## 2019-06-25 DIAGNOSIS — G301 Alzheimer's disease with late onset: Secondary | ICD-10-CM

## 2019-06-25 NOTE — Progress Notes (Signed)
I have read the note, and I agree with the clinical assessment and plan.  Meagan Schwartz K Olamide Carattini   

## 2019-06-25 NOTE — Progress Notes (Signed)
PATIENT: Meagan Schwartz DOB: Apr 06, 1946  REASON FOR VISIT: follow up HISTORY FROM: patient  HISTORY OF PRESENT ILLNESS: Today 06/25/19 :  Meagan Schwartz is a 73 year old female with a history of Alzheimer's disease.  She returns today for follow-up.  She is here today with her husband.  She remains on Aricept and Namenda.  She tolerates the medication well.  Her husband reports that she needs some supervision with ADLs.  She does not operate a motor vehicle.  She no longer handles any of the finances.  She does not cook on the stove anymore.  No change in her mood or behavior.  Denies any hallucinations.  She returns today for evaluation.  HISTORY 06/17/18:  Meagan Schwartz is a 73 year old female with a history of Alzheimer's disease.  She returns today for follow-up.  She is currently on Aricept and Namenda.  She continues with her husband.  She is able to complete all ADLs and the family.  She does not operate a motor vehicle.  Denies any trouble with her sleep.  Reports good appetite.  She does not do much cooking.  But rather makes simple things such as a salad.  Her husband manages their finances.  He also manages her medications and appointments.  She returns today for evaluation.  REVIEW OF SYSTEMS: Out of a complete 14 system review of symptoms, the patient complains only of the following symptoms, and all other reviewed systems are negative.  ALLERGIES: Allergies  Allergen Reactions  . Clindamycin     HOME MEDICATIONS: Outpatient Medications Prior to Visit  Medication Sig Dispense Refill  . donepezil (ARICEPT) 10 MG tablet TAKE 1 TABLET BY MOUTH AT BEDTIME 90 tablet 0  . levothyroxine (SYNTHROID, LEVOTHROID) 75 MCG tablet Take 1 tablet by mouth daily.    . memantine (NAMENDA) 10 MG tablet Take 1 tablet (10 mg total) by mouth 2 (two) times daily. 180 tablet 3  . multivitamin-lutein (OCUVITE-LUTEIN) CAPS capsule Take 1 capsule by mouth daily.    . NON FORMULARY Take 20 mg by  mouth daily. Pqq    . NON FORMULARY Take 250 mg by mouth 2 (two) times daily. Citicoline    . NON FORMULARY Take 200 mg by mouth 2 (two) times daily. Bacopa    . NON FORMULARY Take 500 mg by mouth daily. [place]    . NON FORMULARY Take 1 tablet by mouth 2 (two) times daily. [place]    . Omega-3 Krill Oil 500 MG CAPS     . Red Yeast Rice 600 MG CAPS Take 600 mg by mouth daily.    . Calcium Carbonate-Vit D-Min (CALCIUM 1200) 1200-1000 MG-UNIT CHEW Chew 1 capsule by mouth daily.    . Cholecalciferol (VITAMIN D-3) 1000 UNITS CAPS Take 1,000 Units by mouth daily.    . Coenzyme Q10 (CO Q 10 PO) Take 1 tablet by mouth daily.    . colesevelam (WELCHOL) 625 MG tablet TAKE 1 TO 2 TABLETS BY MOUTH THREE TIMES DAILY WITH MEALS . START WITH 1 TABLET AND TITRATE UP TO 2 AS TOLERATED  5  . estradiol (ESTRACE) 0.1 MG/GM vaginal cream Use 1/2 g vaginally twice weekly 42.5 g 3  . Multiple Vitamin (MULTIVITAMIN) tablet Take 1 tablet by mouth daily.    . NON FORMULARY Take 15 mg by mouth 2 (two) times daily. Znzyme    . NON FORMULARY Take 500 mg by mouth daily. NAC    . NON FORMULARY Take 500 mg  by mouth 2 (two) times daily. Berbonime HCL    . NON FORMULARY Take 300 mg by mouth 2 (two) times daily. Bio Ashwaghanda    . NON FORMULARY Aqueous Silenum 2 drops a day     No facility-administered medications prior to visit.     PAST MEDICAL HISTORY: Past Medical History:  Diagnosis Date  . Alzheimer disease ([place])   . Arthritis   . Hx of colonic polyps 12/27/2014  . Hyperlipidemia   . 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 Vitamin D deficiency     PAST SURGICAL HISTORY: Past Surgical History:  Procedure Laterality Date  . BREAST BIOPSY Left 2016   benign  . COLONOSCOPY  10-04-2009  . VAGINAL DELIVERY     x2    FAMILY HISTORY: Family History  Problem Relation Age of Onset  . Colon cancer Mother   . Heart disease Mother   . Alzheimer's disease Father   . Heart disease Father    . Leukemia Brother   . Breast cancer Maternal Grandmother   . Breast cancer Paternal Grandmother   . Paget's disease of bone Cousin   . Paget's disease of bone Cousin   . Polycythemia Brother   . Graves' disease Daughter   . Graves' disease Son   . Rectal cancer Neg Hx   . Stomach cancer Neg Hx     SOCIAL HISTORY: Social History   Socioeconomic History  . Marital status: Married    Spouse name: [name]  . Number of children: 2  . Years of education: Not on file  . Highest education level: Bachelor's degree (e.g., BA, AB, BS)  Occupational History  . Occupation: retired  Scientific laboratory technician  . Financial resource strain: Not on file  . Food insecurity    Worry: Not on file    Inability: Not on file  . Transportation needs    Medical: Not on file    Non-medical: Not on file  Tobacco Use  . Smoking status: Former Research scientist (life sciences)  . Smokeless tobacco: Never Used  . Tobacco comment: smoked while in college  Substance and Sexual Activity  . Alcohol use: Not Currently  . Drug use: No  . Sexual activity: Not Currently    Partners: Male    Birth control/protection: Post-menopausal  Lifestyle  . Physical activity    Days per week: Not on file    Minutes per session: Not on file  . Stress: Not on file  Relationships  . Social Herbalist on phone: Not on file    Gets together: Not on file    Attends religious service: Not on file    Active member of club or organization: Not on file    Attends meetings of clubs or organizations: Not on file    Relationship status: Not on file  . Intimate partner violence    Fear of current or ex partner: Not on file    Emotionally abused: Not on file    Physically abused: Not on file    Forced sexual activity: Not on file  Other Topics Concern  . Not on file  Social History Narrative   Lives at home with her husband and two dogs   Right handed   About 1 cup of tea every 2 weeks       PHYSICAL EXAM  There were no vitals filed for  this visit. There is no height or weight on file to calculate BMI.   MMSE -  Mini Mental State Exam 06/25/2019 06/17/2018 08/22/2017  Not completed: - (No Data) -  Orientation to time 1 3 2   Orientation to Place 2 2 3   Registration 3 3 3   Attention/ Calculation 0 1 2  Recall 0 0 0  Language- name 2 objects 2 2 2   Language- repeat 1 1 1   Language- follow 3 step command 2 2 3   Language- read & follow direction 1 1 1   Write a sentence 0 1 1  Copy design 0 0 0  Copy design-comments named 3 animals - -  Total score 12 16 18      Generalized: Well developed, in no acute distress   Neurological examination  Mentation: Alert oriented to time, place, history taking. Follows all commands speech and language fluent Cranial nerve II-XII: Pupils were equal round reactive to light. Extraocular movements were full, visual field were full on confrontational test. Facial sensation and strength were normal.  Head turning and shoulder shrug  were normal and symmetric. Motor: The motor testing reveals 5 over 5 strength of all 4 extremities. Good symmetric motor tone is noted throughout.  Sensory: Sensory testing is intact to soft touch on all 4 extremities. No evidence of extinction is noted.  Coordination: Cerebellar testing reveals good finger-nose-finger and heel-to-shin bilaterally.  Gait and station: Gait is normal.  Reflexes: Deep tendon reflexes are symmetric and normal bilaterally.   DIAGNOSTIC DATA (LABS, IMAGING, TESTING) - I reviewed patient records, labs, notes, testing and imaging myself where available.  Lab Results  Component Value Date   WBC 7.2 07/16/2018   HGB 13.5 07/16/2018   HCT 39.5 07/16/2018   MCV 91 07/16/2018   PLT 252 07/16/2018      ASSESSMENT AND PLAN 73 y.o. year old female  has a past medical history of Alzheimer disease ([place]), Arthritis, colonic polyps (12/27/2014), Hyperlipidemia, Thyroid disease, and Vitamin D deficiency. here with:  1.  Memory disturbance   The patient's memory score has declined slightly.  She will continue on Aricept and Namenda.  I have advised that if her symptoms worsen or she develops new symptoms she should let us know.  She will follow-up in 6 months or sooner if needed.   I spent 15 minutes with the patient. 50% of this time was spent reviewing memory score   Ward Givens, MSN, NP-C 06/25/2019, 2:40 PM [facility] [address] [place], [place] [address] [phone]

## 2019-06-25 NOTE — Patient Instructions (Signed)
Your Plan:  Continue aricept and Namenda If your symptoms worsen or you develop new symptoms please let us know.   Thank you for coming to see us at [facility]. I hope we have been able to provide you high quality care today.  You may receive a patient satisfaction survey over the next few weeks. We would appreciate your feedback and comments so that we may continue to improve ourselves and the health of our patients.  

## 2019-06-26 ENCOUNTER — Telehealth: Payer: Self-pay | Admitting: Certified Nurse Midwife

## 2019-06-26 NOTE — Telephone Encounter (Signed)
Left message no answer

## 2019-06-26 NOTE — Telephone Encounter (Signed)
Phone call x 2 and patient message sent regarding wife Geraldene.

## 2019-06-26 NOTE — Telephone Encounter (Signed)
See telephone encounter dated 06/26/19. DL called patient on 06/26/19 at 0738, left message.   Will close this encounter.

## 2019-06-26 NOTE — Telephone Encounter (Signed)
Non-Urgent Medical Question Received: Today Message Contents  [place], Mei A sent to [place]  Phone Number: [phone]        I am still waiting for answer as to whether my wife should continue wirh gyn/mammogram screenings due to worsening dementia. Both primary and neurologist have advised against screenings. She has 11/3 appt scheduled.  Lenoria Chime    DPR on file to talk with spouse [name].

## 2019-06-27 ENCOUNTER — Other Ambulatory Visit: Payer: Self-pay | Admitting: Adult Health

## 2019-06-29 ENCOUNTER — Other Ambulatory Visit: Payer: Self-pay

## 2019-06-29 MED ORDER — MEMANTINE HCL 10 MG PO TABS: 10.0000 mg | ORAL_TABLET | Freq: Two times a day (BID) | ORAL | 3 refills | Status: AC

## 2019-06-30 ENCOUNTER — Telehealth: Payer: Self-pay | Admitting: Certified Nurse Midwife

## 2019-06-30 DIAGNOSIS — H2513 Age-related nuclear cataract, bilateral: Secondary | ICD-10-CM | POA: Diagnosis not present

## 2019-06-30 DIAGNOSIS — H52203 Unspecified astigmatism, bilateral: Secondary | ICD-10-CM | POA: Diagnosis not present

## 2019-06-30 DIAGNOSIS — H5203 Hypermetropia, bilateral: Secondary | ICD-10-CM | POA: Diagnosis not present

## 2019-06-30 NOTE — Telephone Encounter (Signed)
Non-Urgent Medical Question Received: Today Message Contents  [place], Marilynn A sent to [place]  Phone Number: [phone]        Neoma Laming,  I know you tried to call me twice, but I am still waiting for answer as to whether my wife should continue wirh gyn/mammogram screenings due to worsening dementia. Both primary doctor and neurologist have advised against screenings. She has 11/3 appt scheduled. I would like to hear from you before I cancel her appt. Can you reply by email or leave a voice mail if we do not connect by phone?  Thanks,  [name] Art[place]

## 2019-06-30 NOTE — Telephone Encounter (Signed)
Routing to [employer], CNM.

## 2019-06-30 NOTE — Telephone Encounter (Signed)
See patient email sent

## 2019-07-01 ENCOUNTER — Ambulatory Visit: Payer: Medicare Other | Admitting: Physical Therapy

## 2019-07-01 NOTE — Telephone Encounter (Signed)
See MyChart encounter dated 06/30/19.   Encounter closed.

## 2019-07-02 ENCOUNTER — Other Ambulatory Visit: Payer: Self-pay

## 2019-07-02 ENCOUNTER — Ambulatory Visit: Payer: Medicare Other | Attending: Adult Health | Admitting: Rehabilitation

## 2019-07-02 ENCOUNTER — Telehealth: Payer: Self-pay | Admitting: Rehabilitation

## 2019-07-02 ENCOUNTER — Encounter: Payer: Self-pay | Admitting: Rehabilitation

## 2019-07-02 DIAGNOSIS — R296 Repeated falls: Secondary | ICD-10-CM | POA: Diagnosis not present

## 2019-07-02 DIAGNOSIS — R2689 Other abnormalities of gait and mobility: Secondary | ICD-10-CM | POA: Diagnosis not present

## 2019-07-02 DIAGNOSIS — R2681 Unsteadiness on feet: Secondary | ICD-10-CM | POA: Insufficient documentation

## 2019-07-02 NOTE — Telephone Encounter (Signed)
Dr. Rex Kras,   I evaluated Kaleeya Pareja today at [facility] neuro for PT.  Her husband would like to have an OT evaluation as well for UE function/ADLs.  I explained that carryover would likely be limited due to decreased cognition, however he would still like an evaluation.  Would you please fax over OT eval orders if you agree to [phone].    Thanks so much,  Cameron Sprang, PT, MPT [facility] [address] [place], [place], [address] Phone: [phone]   Fax:  [phone] 07/02/19, 3:26 PM

## 2019-07-02 NOTE — Therapy (Signed)
[facility] [address] [place], [place], [address] Phone: [phone]   Fax:  [phone]  Physical Therapy Evaluation  Patient Details  Name: Meagan Schwartz MRN: [id number] Date of Birth: Jan 13, 1946 Referring Provider (PT): Hulan Fess. MD   Encounter Date: 07/02/2019  PT End of Session - 07/02/19 1503    Visit Number  1    Number of Visits  9    Date for PT Re-Evaluation  [id number]   Cert written for 60, however POC only 30 days   Authorization Type  Medicare/BCBS supplement-10th visit progress note needed.    PT Start Time  1354    PT Stop Time  1434    PT Time Calculation (min)  40 min    Activity Tolerance  Patient tolerated treatment well    Behavior During Therapy  WFL for tasks assessed/performed       Past Medical History:  Diagnosis Date  . Alzheimer disease ([place])   . Arthritis   . Hx of colonic polyps 12/27/2014  . Hyperlipidemia   . 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 Vitamin D deficiency     Past Surgical History:  Procedure Laterality Date  . BREAST BIOPSY Left 2016   benign  . COLONOSCOPY  10-04-2009  . VAGINAL DELIVERY     x2    There were no vitals filed for this visit.   Subjective Assessment - 07/02/19 1359    Subjective  Per husband, "She has had a fall and a near fall, so we are wanting to prevent falls and injury."    Patient is accompained by:  Family member   husband, [name]   Pertinent History  Alzheimer's Disease    Limitations  Walking    Patient Stated Goals  We want her to have better balance.    Currently in Pain?  No/denies         [facility] PT Assessment - 07/02/19 0001      Assessment   Medical Diagnosis  History of falls     Referring Provider (PT)  Hulan Fess. MD    Onset Date/Surgical Date  --   Over the last 3-4 months balance has been declining      Precautions   Precautions  Fall      Balance Screen   Has the patient fallen in the past 6  months  Yes    How many times?  2    Has the patient had a decrease in activity level because of a fear of falling?   Yes    Is the patient reluctant to leave their home because of a fear of falling?   Yes      [place]  Private residence    Living Arrangements  Spouse/significant other    Available Help at Discharge  Available 24 hours/day    Type of [place] to enter    Entrance Stairs-Number of Steps  2    Entrance Stairs-Rails  None    Home Layout  Two level;Bed/bath upstairs    Alternate Level Stairs-Number of Steps  12    Alternate Level Stairs-Rails  Right    Home Equipment  None    Additional Comments  Husband typically does not have to assist pt physically but notes that she tends to have LOB on uneven surfaces and also demonstrates R lateral lean at times.  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)      Cognition   Overall Cognitive Status  History of cognitive impairments - at baseline      Observation/Other Assessments   Observations  Note history of Alzheimer's       Sensation   Light Touch  Appears Intact    Hot/Cold  Appears Intact      Coordination   Gross Motor Movements are Fluid and Coordinated  Yes    Fine Motor Movements are Fluid and Coordinated  Yes    Heel Shin Test  Grossly [facility]      Posture/Postural Control   Posture/Postural Control  Postural limitations    Postural Limitations  Rounded Shoulders;Forward head;Posterior pelvic tilt      ROM / Strength   AROM / PROM / Strength  Strength      Strength   Overall Strength  Within functional limits for tasks performed    Overall Strength Comments  Grossly 4/5 overall      Transfers   Transfers  Sit to Stand;Stand to Sit     Sit to Stand  5: Supervision    Sit to Stand Details (indicate cue type and reason)  Pt doesn't come to full stand, remains flexed slightly at knees and hips.     Five time sit to stand comments   12.50 secs without UE support     Stand to Sit  5: Supervision      Ambulation/Gait   Ambulation/Gait  Yes    Ambulation/Gait Assistance  4: Min guard;5: Supervision    Ambulation/Gait Assistance Details  Pt with very slow, shuffled gait pattern.  Tends to land on midfoot with ambulation.  Provided intermittent cues for increased stride length, however pt tends to over compensate.     Ambulation Distance (Feet)  300 Feet   then another 200'   Assistive device  None    Gait Pattern  Step-to pattern;Step-through pattern;Decreased stride length;Right flexed knee in stance;Left flexed knee in stance;Shuffle;Trunk flexed;Narrow base of support    Ambulation Surface  Level;Indoor    Gait velocity  1.96 ft/sec without AD      Standardized Balance Assessment   Standardized Balance Assessment  Dynamic Gait Index;Timed Up and Go Test      Dynamic Gait Index   Level Surface  Mild Impairment    Change in Gait Speed  Mild Impairment    Gait with Horizontal Head Turns  Moderate Impairment    Gait with Vertical Head Turns  Moderate Impairment    Gait and Pivot Turn  Moderate Impairment    Step Over Obstacle  Moderate Impairment    Step Around Obstacles  Mild Impairment    Steps  Mild Impairment    Total Score  12    DGI comment:  Scores of 19 or less are predictive of falls in older community living adults      Timed Up and Go Test   TUG  Normal TUG    Normal TUG (seconds)  16.47   without device               Objective measurements completed on examination: See above findings.              PT Education -  07/02/19 1502    Education Details  Provided education regarding outcome measures used during eval with pts results.  Also educated that progress may be limited due to  cognition, therefore would have pt do a 4 week trial to assess carryover.  Discussed that husband would have to perform exercises with pt daily in order to see carryover and improvement in balance.    Person(s) Educated  Patient;Spouse    Methods  Explanation    Comprehension  Verbalized understanding;Returned demonstration;Need further instruction       PT Short Term Goals - 07/02/19 1516      PT SHORT TERM GOAL #1   Title  =LTGs due to POC        PT Long Term Goals - 07/02/19 1516      PT LONG TERM GOAL #1   Title  Pt will be compliant in performing HEP exercises with assist from husband in order to improve balance and functional mobility. (Target Date: 08/01/19)    Time  4    Period  Weeks    Status  New    Target Date  08/01/19      PT LONG TERM GOAL #2   Title  Pt will improve DGI score to >/=16/24 in order to indicate decreased fall risk.      PT LONG TERM GOAL #3   Title  Pt will improve gait speed to >/=2.56 ft/sec w/ LRAD in order to indicate safe community ambulation.      PT LONG TERM GOAL #4   Title  Pt/spouse will verbalize return to leisure ambulation in order to maintain gains made in functional mobility/endurance.             Plan - 07/02/19 1504    Clinical Impression Statement  Pt presents with diagnosis of Alzheimer's dementia with declining balance over the last 3-4 months per husband report.  Note history of arthritis and HLD.  Upon PT evaluation, pt demonstrates generalized weakness, and decreased balance.  Gait speed of 1.96 ft/sec, indicative of limited community ambulator, 5TSS time of 16.47 secs indicative of elevated fall risk and decreased functional strength, and DGI score of 12/24, indicative of high fall risk.  Feel that pts progress may be limited due to cognitive state, and discussed this with husband, however will have pt come for 4 week trial of PT to assess carryover with exercises/progress at home.  Pt and spouse verbalize understanding.     Personal Factors and Comorbidities  Age;Comorbidity 1    Comorbidities  Alzheimer's Disease    Examination-Activity Limitations  Locomotion Level;Squat;Stairs;Stand    Examination-Participation Restrictions  Other   walking dogs   Stability/Clinical Decision Making  Stable/Uncomplicated    Clinical Decision Making  Low    Rehab Potential  Fair    PT Frequency  2x / week    PT Duration  4 weeks    PT Treatment/Interventions  ADLs/Self Care Home Management;Functional mobility training;Therapeutic activities;Gait training;DME Instruction;Stair training;Therapeutic exercise;Balance training;Neuromuscular re-education;Patient/family education    PT Next Visit Plan  Establish HEP for balance and BLE strengthening-ensure that husband is present for improved carryover at home, gait for improved stride length and heel to toe contact.    Consulted and Agree with Plan of Care  Patient;Family member/caregiver    Family Member Consulted  husband [name]       Patient will benefit from skilled therapeutic intervention in order to improve the following deficits and impairments:  Abnormal gait, Decreased cognition, Decreased balance, Decreased  knowledge of use of DME, Decreased knowledge of precautions, Decreased mobility, Postural dysfunction  Visit Diagnosis: Unsteadiness on feet  Repeated falls  Other abnormalities of gait and mobility     Problem List Patient Active Problem List   Diagnosis Date Noted  . Dementia of the Alzheimer's type, with late onset, with depressed mood ([place]) 10/14/2017  . Cognitive deficits 02/20/2017  . Postural dizziness with presyncope 02/20/2017  . Hx of colonic polyps 10/04/2009    Cameron Sprang, PT, MPT [facility] [address] [place], [place], [address] Phone: [phone]   Fax:  [phone] 07/02/19, 3:22 PM  Name: Meagan Schwartz MRN: [id number] Date of Birth: April 12, 1946

## 2019-07-13 ENCOUNTER — Ambulatory Visit: Payer: Medicare Other | Admitting: Rehabilitation

## 2019-07-13 ENCOUNTER — Other Ambulatory Visit: Payer: Self-pay

## 2019-07-13 ENCOUNTER — Encounter: Payer: Self-pay | Admitting: Rehabilitation

## 2019-07-13 DIAGNOSIS — R2689 Other abnormalities of gait and mobility: Secondary | ICD-10-CM | POA: Diagnosis not present

## 2019-07-13 DIAGNOSIS — R2681 Unsteadiness on feet: Secondary | ICD-10-CM | POA: Diagnosis not present

## 2019-07-13 DIAGNOSIS — R296 Repeated falls: Secondary | ICD-10-CM | POA: Diagnosis not present

## 2019-07-13 NOTE — Therapy (Signed)
[facility] [address] [place], [place], [address] Phone: [phone]   Fax:  [phone]  Physical Therapy Treatment  Patient Details  Name: Meagan Schwartz MRN: [id number] Date of Birth: 1946-07-06 Referring Provider (PT): Hulan Fess. MD   Encounter Date: 07/13/2019  PT End of Session - 07/13/19 1410    Visit Number  2    Number of Visits  9    Date for PT Re-Evaluation  [id number]   Cert written for 78, however POC only 30 days   Authorization Type  Medicare/BCBS supplement-10th visit progress note needed.    PT Start Time  1317    PT Stop Time  1402    PT Time Calculation (min)  45 min    Activity Tolerance  Patient tolerated treatment well    Behavior During Therapy  WFL for tasks assessed/performed       Past Medical History:  Diagnosis Date  . Alzheimer disease ([place])   . Arthritis   . Hx of colonic polyps 12/27/2014  . Hyperlipidemia   . 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 Vitamin D deficiency     Past Surgical History:  Procedure Laterality Date  . BREAST BIOPSY Left 2016   benign  . COLONOSCOPY  10-04-2009  . VAGINAL DELIVERY     x2    There were no vitals filed for this visit.  Subjective Assessment - 07/13/19 1322    Subjective  Pt/husband report no changes since last session.    Patient is accompained by:  Family member    Patient Stated Goals  We want her to have better balance.    Currently in Pain?  No/denies                            Balance Exercises - 07/13/19 1403      OTAGO PROGRAM   Back Extension  Standing;5 reps    Trunk Movements  Standing;5 reps    Knee Flexor  10 reps    Hip ABductor  10 reps    Ankle Plantorflexors  20 reps, support    Ankle Dorsiflexors  20 reps, support    Knee Bends  10 reps, support    Backwards Walking  Support   x 4 laps    Sideways Walking  Assistive device   x 4 laps    Tandem Stance  10 seconds, support    x 3 reps each    Tandem Walk  Support   x 4 laps    One Leg Stand  10 seconds, support   x 3 reps each (can almost do without support)   Heel Walking  Support   x 4 laps   Toe Walk  Support   x 4 laps    Heel Toe Walking Backward  --   with support        PT Education - 07/13/19 1410    Education Details  Educated and went through [employer] with pt/husband to begin at home, instructed to bring to next visit to finish.    Person(s) Educated  Patient;Spouse    Methods  Explanation;Demonstration;Handout    Comprehension  Verbalized understanding;Returned demonstration       PT Short Term Goals - 07/02/19 1516      PT SHORT TERM GOAL #1   Title  =LTGs due to POC        PT Long Term  Goals - 07/02/19 1516      PT LONG TERM GOAL #1   Title  Pt will be compliant in performing HEP exercises with assist from husband in order to improve balance and functional mobility. (Target Date: 08/01/19)    Time  4    Period  Weeks    Status  New    Target Date  08/01/19      PT LONG TERM GOAL #2   Title  Pt will improve DGI score to >/=16/24 in order to indicate decreased fall risk.      PT LONG TERM GOAL #3   Title  Pt will improve gait speed to >/=2.56 ft/sec w/ LRAD in order to indicate safe community ambulation.      PT LONG TERM GOAL #4   Title  Pt/spouse will verbalize return to leisure ambulation in order to maintain gains made in functional mobility/endurance.            Plan - 07/13/19 1411    Clinical Impression Statement  Skilled session focused on initiated HEP with OTAGO with pt and husband present to be able to provide cues to pt at home.   Instructed to perform 5-6 exercises daily if able and to bring to next session to complete OTAGO HEP.    Personal Factors and Comorbidities  Age;Comorbidity 1    Comorbidities  Alzheimer's Disease    Examination-Activity Limitations  Locomotion Level;Squat;Stairs;Stand    Examination-Participation Restrictions  Other    walking dogs   Stability/Clinical Decision Making  Stable/Uncomplicated    Rehab Potential  Fair    PT Frequency  2x / week    PT Duration  4 weeks    PT Treatment/Interventions  ADLs/Self Care Home Management;Functional mobility training;Therapeutic activities;Gait training;DME Instruction;Stair training;Therapeutic exercise;Balance training;Neuromuscular re-education;Patient/family education    PT Next Visit [place] with pt/spouse or daughter, gait for improved stride length and heel to toe contact, high level balance    Consulted and Agree with Plan of Care  Patient;Family member/caregiver    Family Member Consulted  husband [name]       Patient will benefit from skilled therapeutic intervention in order to improve the following deficits and impairments:  Abnormal gait, Decreased cognition, Decreased balance, Decreased knowledge of use of DME, Decreased knowledge of precautions, Decreased mobility, Postural dysfunction  Visit Diagnosis: Unsteadiness on feet  Repeated falls  Other abnormalities of gait and mobility     Problem List Patient Active Problem List   Diagnosis Date Noted  . Dementia of the Alzheimer's type, with late onset, with depressed mood ([place]) 10/14/2017  . Cognitive deficits 02/20/2017  . Postural dizziness with presyncope 02/20/2017  . Hx of colonic polyps 10/04/2009   Cameron Sprang, PT, MPT [facility] [address] [place], [place], [address] Phone: [phone]   Fax:  [phone] 07/13/19, 3:39 PM  Name: Meagan Schwartz MRN: [id number] Date of Birth: 1946-06-13

## 2019-07-16 ENCOUNTER — Ambulatory Visit: Payer: Medicare Other | Admitting: Rehabilitation

## 2019-07-16 ENCOUNTER — Other Ambulatory Visit: Payer: Self-pay

## 2019-07-16 DIAGNOSIS — R2689 Other abnormalities of gait and mobility: Secondary | ICD-10-CM | POA: Diagnosis not present

## 2019-07-16 DIAGNOSIS — R296 Repeated falls: Secondary | ICD-10-CM | POA: Diagnosis not present

## 2019-07-16 DIAGNOSIS — R2681 Unsteadiness on feet: Secondary | ICD-10-CM

## 2019-07-16 NOTE — Therapy (Signed)
[facility] [address] [place], [place], [address] Phone: [phone]   Fax:  [phone]  Physical Therapy Treatment  Patient Details  Name: Meagan Schwartz MRN: [id number] Date of Birth: [birth year] Referring Provider (PT): Hulan Fess. MD   Encounter Date: 07/16/2019  PT End of Session - 07/16/19 1454    Visit Number  3    Number of Visits  9    Date for PT Re-Evaluation  123XX123   Cert written for 60, however POC only 30 days   Authorization Type  Medicare/BCBS supplement-10th visit progress note needed.    PT Start Time  1403    PT Stop Time  1445    PT Time Calculation (min)  42 min    Activity Tolerance  Patient tolerated treatment well    Behavior During Therapy  WFL for tasks assessed/performed       Past Medical History:  Diagnosis Date  . Alzheimer disease ([place])   . Arthritis   . Hx of colonic polyps 12/27/2014  . Hyperlipidemia   . 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 Vitamin D deficiency     Past Surgical History:  Procedure Laterality Date  . BREAST BIOPSY Left 2016   benign  . COLONOSCOPY  10-04-2009  . VAGINAL DELIVERY     x2    There were no vitals filed for this visit.  Subjective Assessment - 07/16/19 1453    Subjective  Pt present with daughter today, no changes since last session.  Did have a couple of questions regarding OTAGO, but has been performing at home.    Patient is accompained by:  Family member    Pertinent History  Alzheimer's Disease    Limitations  Walking    Patient Stated Goals  We want her to have better balance.    Currently in Pain?  No/denies                       [facility] Adult PT Treatment/Exercise - 07/16/19 1439      Ambulation/Gait   Ambulation/Gait  Yes    Ambulation/Gait Assistance  4: Min guard    Ambulation/Gait Assistance Details  Min/guard to facilitate increased gait speed during gait training.  Provided cues for  increased stride length and improved heel contact.  Pt able to intermittently improve gait, however very difficult to maintain throughout session.     Ambulation Distance (Feet)  300 Feet    Assistive device  None    Gait Pattern  Step-to pattern;Step-through pattern;Decreased stride length;Right flexed knee in stance;Left flexed knee in stance;Shuffle;Trunk flexed;Narrow base of support    Ambulation Surface  Level;Indoor      Neuro Re-ed    Neuro Re-ed Details   Corner balance standing on pillows with feet slightly apart EO with head turns side/side and up/down x 10 reps, feet together EC maintaining balance x 3 sets of 20 secs, standing at counter top stepping over balance beam and backwards x 10 reps each side, side stepping over barrier x 10 reps,  Cone tapping alternating LEs x 6 reps to the R and back to the L.  Pt requires heavy verbal and demo cues throughout due to poor cognition.   Pt did very well eliciting ankle and hip strategy on pillows with increased reps.            Balance Exercises - 07/16/19 [place]  Head Movements  Sitting;5 reps    Neck Movements  Sitting;5 reps    Ankle Movements  Sitting;10 reps    Knee Extensor  10 reps   combined knee ext and ankle DF/PF, see handout   Sit to Stand  10 reps, no support        PT Education - 07/16/19 1454    Education Details  Answered questions regarding OTAGO, finished exercises during session.    Person(s) Educated  Patient;Child(ren)    Methods  Explanation;Demonstration    Comprehension  Verbalized understanding;Returned demonstration       PT Short Term Goals - 07/02/19 1516      PT SHORT TERM GOAL #1   Title  =LTGs due to POC        PT Long Term Goals - 07/02/19 1516      PT LONG TERM GOAL #1   Title  Pt will be compliant in performing HEP exercises with assist from husband in order to improve balance and functional mobility. (Target Date: 08/01/19)    Time  4    Period  Weeks     Status  New    Target Date  08/01/19      PT LONG TERM GOAL #2   Title  Pt will improve DGI score to >/=16/24 in order to indicate decreased fall risk.      PT LONG TERM GOAL #3   Title  Pt will improve gait speed to >/=2.56 ft/sec w/ LRAD in order to indicate safe community ambulation.      PT LONG TERM GOAL #4   Title  Pt/spouse will verbalize return to leisure ambulation in order to maintain gains made in functional mobility/endurance.            Plan - 07/16/19 1455    Clinical Impression Statement  Skilled session focused on finishing up [place] for continued use at home.  Also addressed balance with compliant surfaces and activities to improve step length and SLS.  Pt continues to be very limited due to poor cognition during sessions, requiring max verbal and demo cues.    Personal Factors and Comorbidities  Age;Comorbidity 1    Comorbidities  Alzheimer's Disease    Examination-Activity Limitations  Locomotion Level;Squat;Stairs;Stand    Examination-Participation Restrictions  Other   walking dogs   Stability/Clinical Decision Making  Stable/Uncomplicated    Rehab Potential  Fair    PT Frequency  2x / week    PT Duration  4 weeks    PT Treatment/Interventions  ADLs/Self Care Home Management;Functional mobility training;Therapeutic activities;Gait training;DME Instruction;Stair training;Therapeutic exercise;Balance training;Neuromuscular re-education;Patient/family education    PT Next Visit Plan  gait for improved stride length and heel to toe contact, high level balance, she's tricky because she is higher level but due to cognition is difficult to progress.    Consulted and Agree with Plan of Care  Patient;Family member/caregiver    Family Member Consulted  daughter, [place]       Patient will benefit from skilled therapeutic intervention in order to improve the following deficits and impairments:  Abnormal gait, Decreased cognition, Decreased balance, Decreased knowledge  of use of DME, Decreased knowledge of precautions, Decreased mobility, Postural dysfunction  Visit Diagnosis: Unsteadiness on feet  Repeated falls  Other abnormalities of gait and mobility     Problem List Patient Active Problem List   Diagnosis Date Noted  . Dementia of the Alzheimer's type, with late onset, with depressed mood ([place]) 10/14/2017  . Cognitive deficits  02/20/2017  . Postural dizziness with presyncope 02/20/2017  . Hx of colonic polyps 10/04/2009    Cameron Sprang, PT, MPT [facility] [address] [place], [place], [address] Phone: [phone]   Fax:  [phone] 07/16/19, 2:58 PM  Name: SANDY JOENS MRN: [id number] Date of Birth: 06/13/1946

## 2019-07-20 ENCOUNTER — Other Ambulatory Visit: Payer: Self-pay

## 2019-07-20 ENCOUNTER — Ambulatory Visit: Payer: Medicare Other | Attending: Adult Health | Admitting: Physical Therapy

## 2019-07-20 ENCOUNTER — Encounter: Payer: Self-pay | Admitting: Physical Therapy

## 2019-07-20 DIAGNOSIS — R2689 Other abnormalities of gait and mobility: Secondary | ICD-10-CM | POA: Insufficient documentation

## 2019-07-20 DIAGNOSIS — R296 Repeated falls: Secondary | ICD-10-CM | POA: Insufficient documentation

## 2019-07-20 DIAGNOSIS — R2681 Unsteadiness on feet: Secondary | ICD-10-CM | POA: Diagnosis not present

## 2019-07-20 NOTE — Therapy (Signed)
[facility] [address] [place], [place], [address] Phone: [phone]   Fax:  [phone]  Physical Therapy Treatment  Patient Details  Name: Meagan Schwartz MRN: [id number] Date of Birth: 02/02/1946 Referring Provider (PT): Hulan Fess. MD   Encounter Date: 07/20/2019  PT End of Session - 07/20/19 1602    Visit Number  4    Number of Visits  9    Date for PT Re-Evaluation  [id number]   Cert written for 89, however POC only 30 days   Authorization Type  Medicare/BCBS supplement-10th visit progress note needed.    PT Start Time  1319    PT Stop Time  1401    PT Time Calculation (min)  42 min    Equipment Utilized During Treatment  Gait belt    Activity Tolerance  Patient tolerated treatment well    Behavior During Therapy  WFL for tasks assessed/performed       Past Medical History:  Diagnosis Date  . Alzheimer disease ([place])   . Arthritis   . Hx of colonic polyps 12/27/2014  . Hyperlipidemia   . 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 Vitamin D deficiency     Past Surgical History:  Procedure Laterality Date  . BREAST BIOPSY Left 2016   benign  . COLONOSCOPY  10-04-2009  . VAGINAL DELIVERY     x2    There were no vitals filed for this visit.  Subjective Assessment - 07/20/19 1324    Subjective  Daughter present with pt today.  Denies falls or changes.  Pt denies pain.  States she doesnt like the mask.    Patient is accompained by:  Family member    Pertinent History  Alzheimer's Disease    Limitations  Walking    Patient Stated Goals  We want her to have better balance.    Currently in Pain?  No/denies         [facility] Adult PT Treatment/Exercise - 07/20/19 0001      Transfers   Transfers  Sit to Stand;Stand to Sit    Sit to Stand  5: Supervision;4: Min guard    Sit to Stand Details  Verbal cues for technique;Verbal cues for precautions/safety    Sit to Stand Details (indicate cue type and  reason)  Pt does not come to full standing;tends to keep knees and hips flexed    Stand to Sit  5: Supervision    Number of Reps  10 reps;Other sets (comment)    Comments  Sit<>stand on non compliant surface x 10, blue foam mat x 10 then blue foam beam x 10      Ambulation/Gait   Ambulation/Gait  Yes    Ambulation/Gait Assistance  4: Min guard;5: Supervision    Ambulation/Gait Assistance Details  Pt with decreased cadence and tends to keep bil knees and hips flexed.  Poor heel strike and is not able to follow commands consistently to change gait pattern.  Attempted having pt increase cadence with little success with verbal and tactile cues.    Ambulation Distance (Feet)  330 Feet   x 1, 220' x 1 and through out gym during activities   Assistive device  None;Other (Comment)   Treadmill   Gait Pattern  Step-to pattern;Step-through pattern;Decreased stride length;Right flexed knee in stance;Left flexed knee in stance;Shuffle;Trunk flexed;Narrow base of support    Ambulation Surface  Level;Indoor    Gait Comments  Gait training attempted on  treadmill at 1.3 mph.  Pt needs cues to attempt to stay close to front of treadmill, to increase step length and to increase heel strike. Pt tending to shuffle feet along platform desipite instructions.      High Level Balance   High Level Balance Activities  Other (comment)    High Level Balance Comments  Zoom ball on non compliant surface with max verbal cues on how to use zoom ball;repeated with pt standing on blue foam mat with min-cga for balance      Knee/Hip Exercises: Aerobic   Other Aerobic  Scifit level 1.5-2.0 initially with all 4 extremities then with LE's only as pt c/o arm fatigue.  Performed 10 minutes total               PT Short Term Goals - 07/02/19 1516      PT SHORT TERM GOAL #1   Title  =LTGs due to POC        PT Long Term Goals - 07/02/19 1516      PT LONG TERM GOAL #1   Title  Pt will be compliant in performing HEP  exercises with assist from husband in order to improve balance and functional mobility. (Target Date: 08/01/19)    Time  4    Period  Weeks    Status  New    Target Date  08/01/19      PT LONG TERM GOAL #2   Title  Pt will improve DGI score to >/=16/24 in order to indicate decreased fall risk.      PT LONG TERM GOAL #3   Title  Pt will improve gait speed to >/=2.56 ft/sec w/ LRAD in order to indicate safe community ambulation.      PT LONG TERM GOAL #4   Title  Pt/spouse will verbalize return to leisure ambulation in order to maintain gains made in functional mobility/endurance.            Plan - 07/20/19 1603    Clinical Impression Statement  Skilled session focused on gait, balance and endurance/strengthening.  Pt continues to have difficulty following commands and has decreased speed with activities.  Continue PT per POC.    Personal Factors and Comorbidities  Age;Comorbidity 1    Comorbidities  Alzheimer's Disease    Examination-Activity Limitations  Locomotion Level;Squat;Stairs;Stand    Examination-Participation Restrictions  Other   walking dogs   Stability/Clinical Decision Making  Stable/Uncomplicated    Rehab Potential  Fair    PT Frequency  2x / week    PT Duration  4 weeks    PT Treatment/Interventions  ADLs/Self Care Home Management;Functional mobility training;Therapeutic activities;Gait training;DME Instruction;Stair training;Therapeutic exercise;Balance training;Neuromuscular re-education;Patient/family education    PT Next Visit Plan  gait for improved stride length and heel to toe contact, (treadmill was not very helpful on last visit with this) high level balance, she's tricky because she is higher level but due to cognition is difficult to progress.    Consulted and Agree with Plan of Care  Patient;Family member/caregiver    Family Member Consulted  daughter, [place]       Patient will benefit from skilled therapeutic intervention in order to improve the  following deficits and impairments:  Abnormal gait, Decreased cognition, Decreased balance, Decreased knowledge of use of DME, Decreased knowledge of precautions, Decreased mobility, Postural dysfunction  Visit Diagnosis: Unsteadiness on feet  Repeated falls  Other abnormalities of gait and mobility     Problem List Patient Active Problem  List   Diagnosis Date Noted  . Dementia of the Alzheimer's type, with late onset, with depressed mood ([place]) 10/14/2017  . Cognitive deficits 02/20/2017  . Postural dizziness with presyncope 02/20/2017  . Hx of colonic polyps 10/04/2009    Narda Bonds, PTA [facility] 07/20/19 4:06 PM Phone: [phone] Fax: [facility] [address] [place], [place], [address] Phone: [phone]   Fax:  [phone]  Name: Meagan Schwartz MRN: [id number] Date of Birth: 12-Jan-1946

## 2019-07-21 ENCOUNTER — Ambulatory Visit (INDEPENDENT_AMBULATORY_CARE_PROVIDER_SITE_OTHER): Payer: Medicare Other | Admitting: Certified Nurse Midwife

## 2019-07-21 ENCOUNTER — Other Ambulatory Visit: Payer: Self-pay

## 2019-07-21 ENCOUNTER — Other Ambulatory Visit (HOSPITAL_COMMUNITY)
Admission: RE | Admit: 2019-07-21 | Discharge: 2019-07-21 | Disposition: A | Discharge status: Home / Self Care | Payer: Medicare Other | Source: Ambulatory Visit | Attending: Certified Nurse Midwife | Admitting: Certified Nurse Midwife

## 2019-07-21 ENCOUNTER — Encounter: Payer: Self-pay | Admitting: Certified Nurse Midwife

## 2019-07-21 VITALS — BP 112/64 | HR 64 | Temp 97.1°F | Resp 16 | Wt 113.0 lb

## 2019-07-21 DIAGNOSIS — Z124 Encounter for screening for malignant neoplasm of cervix: Secondary | ICD-10-CM | POA: Diagnosis not present

## 2019-07-21 DIAGNOSIS — Z01419 Encounter for gynecological examination (general) (routine) without abnormal findings: Secondary | ICD-10-CM

## 2019-07-21 DIAGNOSIS — G300 Alzheimer's disease with early onset: Secondary | ICD-10-CM

## 2019-07-21 DIAGNOSIS — Z1151 Encounter for screening for human papillomavirus (HPV): Secondary | ICD-10-CM | POA: Insufficient documentation

## 2019-07-21 DIAGNOSIS — F0280 Dementia in other diseases classified elsewhere without behavioral disturbance: Secondary | ICD-10-CM

## 2019-07-21 NOTE — Progress Notes (Signed)
73 y.o. G67P2002 Married  Caucasian Fe here for annual exam. Post menopausal no vaginal bleeding or dryness. No  Incontinence or bowel issues. See Dr. Rex Kras every 6 months for medication management of hypothyroid, aricept, labs and exam. Spouse [name]) here with patient. Feel she is doing well, eating better, but memory issues have progressed some. Had a fall trying to empty bladder, but no injury. Occasional incontinence with trying to pants down to urinate only. No other health issues.  Patient's last menstrual period was 09/17/1996 (approximate).          Sexually active: No.  The current method of family planning is post menopausal status.    Exercising: Yes.    walking Smoker:  no  Review of Systems  Constitutional: Negative.   HENT: Negative.   Eyes: Negative.   Respiratory: Negative.   Cardiovascular: Negative.   Gastrointestinal: Negative.   Genitourinary: Negative.   Musculoskeletal: Negative.   Skin: Negative.   Neurological: Negative.   Endo/Heme/Allergies: Negative.   Psychiatric/Behavioral: Negative.     Health Maintenance: Pap:  06-27-17 neg History of Abnormal Pap: no MMG:  10-29-2018 category c density birads 1:neg Self Breast exams: tries Colonoscopy:  2016 f/u 11yrs BMD:   2016 pcp TDaP:  06/2019 Shingles: 2019 Pneumonia: unsure Hep C and HIV: not done Labs: with PCP.   reports that she has quit smoking. She has never used smokeless tobacco. She reports previous alcohol use. She reports that she does not use drugs.  Past Medical History:  Diagnosis Date  . Alzheimer disease ([place])   . Arthritis   . Hx of colonic polyps 12/27/2014  . Hyperlipidemia   . 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 Vitamin D deficiency     Past Surgical History:  Procedure Laterality Date  . BREAST BIOPSY Left 2016   benign  . COLONOSCOPY  10-04-2009  . VAGINAL DELIVERY     x2    Current Outpatient Medications  Medication Sig Dispense Refill  . donepezil (ARICEPT)  10 MG tablet TAKE 1 TABLET BY MOUTH AT BEDTIME 90 tablet 0  . levothyroxine (SYNTHROID, LEVOTHROID) 75 MCG tablet Take 1 tablet by mouth daily.    . memantine (NAMENDA) 10 MG tablet Take 1 tablet (10 mg total) by mouth 2 (two) times daily. 180 tablet 3  . multivitamin-lutein (OCUVITE-LUTEIN) CAPS capsule Take 1 capsule by mouth daily.    Marland Kitchen UNABLE TO FIND 1 tablet daily at 2 PM. Med Name:"Re-Code"     No current facility-administered medications for this visit.     Family History  Problem Relation Age of Onset  . Colon cancer Mother   . Heart disease Mother   . Alzheimer's disease Father   . Heart disease Father   . Leukemia Brother   . Breast cancer Maternal Grandmother   . Breast cancer Paternal Grandmother   . Paget's disease of bone Cousin   . Paget's disease of bone Cousin   . Polycythemia Brother   . Graves' disease Daughter   . Graves' disease Son   . Rectal cancer Neg Hx   . Stomach cancer Neg Hx     ROS:  Pertinent items are noted in HPI.  Otherwise, a comprehensive ROS was negative.  Exam:   BP 112/64   Pulse 64   Temp (!) 97.1 F (36.2 C) (Skin)   Resp 16   Wt 113 lb (51.3 kg)   LMP 09/17/1996 (Approximate)   BMI 20.67 kg/m  Ht Readings from Last 3 Encounters:  06/25/19 5\' 2"  (1.575 m)  07/16/18 5' 1.75" (1.568 m)  06/17/18 5\' 4"  (1.626 m)    General appearance: alert, cooperative and appears stated age Head: Normocephalic, without obvious abnormality, atraumatic Neck: no adenopathy, supple, symmetrical, trachea midline and thyroid normal to inspection and palpation Lungs: clear to auscultation bilaterally Breasts: normal appearance, no masses or tenderness, No nipple retraction or dimpling, No nipple discharge or bleeding, No axillary or supraclavicular adenopathy Heart: regular rate and rhythm Abdomen: soft, non-tender; no masses,  no organomegaly Extremities: extremities normal, atraumatic, no cyanosis or edema Skin: Skin color, texture, turgor  normal. No rashes or lesions Lymph nodes: Cervical, supraclavicular, and axillary nodes normal. No abnormal inguinal nodes palpated Neurologic: Grossly normal   Pelvic: External genitalia:  no lesions              Urethra:  normal appearing urethra with no masses, tenderness or lesions              Bartholin's and Skene's: normal                 Vagina: normal appearing vagina with normal color and discharge, no lesions              Cervix: no cervical motion tenderness, no lesions and normal appearance              Pap taken: Yes.   Bimanual Exam:  Uterus:  normal size, contour, position, consistency, mobility, non-tender and anteverted              Adnexa: normal adnexa and no mass, fullness, tenderness               Rectovaginal: Confirms               Anus:  normal sphincter tone, no lesions  Chaperone present: yes  A:  Well Woman with normal exam  Post menopausal   Alzheimer's with memory change on Aricept with PCP management  Hypothyroid with PCP management  Weight stable    P:   Reviewed health and wellness pertinent to exam  Discussed with spouse present importance of consistent routines that she is used to with household. Discussed obtaining Potty chair for toilet use to avoid falls when getting up. Use of light weight disposable panties for incontinence prevention when out. Discussed that all skin is normal in appearance and she is caring for herself appropriately vaginal area.  Continue follow up with PCP as indicated.  Pap smear: yes   counseled on breast self exam, mammography screening, feminine hygiene, adequate intake of calcium and vitamin D, diet and exercise  return annually or prn  An After Visit Summary was printed and given to the patient.

## 2019-07-21 NOTE — Patient Instructions (Signed)
EXERCISE AND DIET:  We recommended that you start or continue a regular exercise program for good health. Regular exercise means any activity that makes your heart beat faster and makes you sweat.  We recommend exercising at least 30 minutes per day at least 3 days a week, preferably 4 or 5.  We also recommend a diet low in fat and sugar.  Inactivity, poor dietary choices and obesity can cause diabetes, heart attack, stroke, and kidney damage, among others.    ALCOHOL AND SMOKING:  Women should limit their alcohol intake to no more than 7 drinks/beers/glasses of wine (combined, not each!) per week. Moderation of alcohol intake to this level decreases your risk of breast cancer and liver damage. And of course, no recreational drugs are part of a healthy lifestyle.  And absolutely no smoking or even second hand smoke. Most people know smoking can cause heart and lung diseases, but did you know it also contributes to weakening of your bones? Aging of your skin?  Yellowing of your teeth and nails?  CALCIUM AND VITAMIN D:  Adequate intake of calcium and Vitamin D are recommended.  The recommendations for exact amounts of these supplements seem to change often, but generally speaking 600 mg of calcium (either carbonate or citrate) and 800 units of Vitamin D per day seems prudent. Certain women may benefit from higher intake of Vitamin D.  If you are among these women, your doctor will have told you during your visit.    PAP SMEARS:  Pap smears, to check for cervical cancer or precancers,  have traditionally been done yearly, although recent scientific advances have shown that most women can have pap smears less often.  However, every woman still should have a physical exam from her gynecologist every year. It will include a breast check, inspection of the vulva and vagina to check for abnormal growths or skin changes, a visual exam of the cervix, and then an exam to evaluate the size and shape of the uterus and  ovaries.  And after 72 years of age, a rectal exam is indicated to check for rectal cancers. We will also provide age appropriate advice regarding health maintenance, like when you should have certain vaccines, screening for sexually transmitted diseases, bone density testing, colonoscopy, mammograms, etc.   MAMMOGRAMS:  All women over 40 years old should have a yearly mammogram. Many facilities now offer a "3D" mammogram, which may cost around $50 extra out of pocket. If possible,  we recommend you accept the option to have the 3D mammogram performed.  It both reduces the number of women who will be called back for extra views which then turn out to be normal, and it is better than the routine mammogram at detecting truly abnormal areas.    COLONOSCOPY:  Colonoscopy to screen for colon cancer is recommended for all women at age 50.  We know, you hate the idea of the prep.  We agree, BUT, having colon cancer and not knowing it is worse!!  Colon cancer so often starts as a polyp that can be seen and removed at colonscopy, which can quite literally save your life!  And if your first colonoscopy is normal and you have no family history of colon cancer, most women don't have to have it again for 10 years.  Once every ten years, you can do something that may end up saving your life, right?  We will be happy to help you get it scheduled when you are ready.    Be sure to check your insurance coverage so you understand how much it will cost.  It may be covered as a preventative service at no cost, but you should check your particular policy.      Fall Prevention in the Home, Adult Falls can cause injuries. They can happen to people of all ages. There are many things you can do to make your home safe and to help prevent falls. Ask for help when making these changes, if needed. What actions can I take to prevent falls? General Instructions  Use good lighting in all rooms. Replace any light bulbs that burn out.   Turn on the lights when you go into a dark area. Use night-lights.  Keep items that you use often in easy-to-reach places. Lower the shelves around your home if necessary.  Set up your furniture so you have a clear path. Avoid moving your furniture around.  Do not have throw rugs and other things on the floor that can make you trip.  Avoid walking on wet floors.  If any of your floors are uneven, fix them.  Add color or contrast paint or tape to clearly mark and help you see: ? Any grab bars or handrails. ? First and last steps of stairways. ? Where the edge of each step is.  If you use a stepladder: ? Make sure that it is fully opened. Do not climb a closed stepladder. ? Make sure that both sides of the stepladder are locked into place. ? Ask someone to hold the stepladder for you while you use it.  If there are any pets around you, be aware of where they are. What can I do in the bathroom?      Keep the floor dry. Clean up any water that spills onto the floor as soon as it happens.  Remove soap buildup in the tub or shower regularly.  Use non-skid mats or decals on the floor of the tub or shower.  Attach bath mats securely with double-sided, non-slip rug tape.  If you need to sit down in the shower, use a plastic, non-slip stool.  Install grab bars by the toilet and in the tub and shower. Do not use towel bars as grab bars. What can I do in the bedroom?  Make sure that you have a light by your bed that is easy to reach.  Do not use any sheets or blankets that are too big for your bed. They should not hang down onto the floor.  Have a firm chair that has side arms. You can use this for support while you get dressed. What can I do in the kitchen?  Clean up any spills right away.  If you need to reach something above you, use a strong step stool that has a grab bar.  Keep electrical cords out of the way.  Do not use floor polish or wax that makes floors slippery.  If you must use wax, use non-skid floor wax. What can I do with my stairs?  Do not leave any items on the stairs.  Make sure that you have a light switch at the top of the stairs and the bottom of the stairs. If you do not have them, ask someone to add them for you.  Make sure that there are handrails on both sides of the stairs, and use them. Fix handrails that are broken or loose. Make sure that handrails are as long as the stairways.  Install non-slip stair  treads on all stairs in your home.  Avoid having throw rugs at the top or bottom of the stairs. If you do have throw rugs, attach them to the floor with carpet tape.  Choose a carpet that does not hide the edge of the steps on the stairway.  Check any carpeting to make sure that it is firmly attached to the stairs. Fix any carpet that is loose or worn. What can I do on the outside of my home?  Use bright outdoor lighting.  Regularly fix the edges of walkways and driveways and fix any cracks.  Remove anything that might make you trip as you walk through a door, such as a raised step or threshold.  Trim any bushes or trees on the path to your home.  Regularly check to see if handrails are loose or broken. Make sure that both sides of any steps have handrails.  Install guardrails along the edges of any raised decks and porches.  Clear walking paths of anything that might make someone trip, such as tools or rocks.  Have any leaves, snow, or ice cleared regularly.  Use sand or salt on walking paths during winter.  Clean up any spills in your garage right away. This includes grease or oil spills. What other actions can I take?  Wear shoes that: ? Have a low heel. Do not wear high heels. ? Have rubber bottoms. ? Are comfortable and fit you well. ? Are closed at the toe. Do not wear open-toe sandals.  Use tools that help you move around (mobility aids) if they are needed. These include: ? Canes. ? Walkers. ? Scooters. ?  Crutches.  Review your medicines with your doctor. Some medicines can make you feel dizzy. This can increase your chance of falling. Ask your doctor what other things you can do to help prevent falls. Where to find more information  Centers for Disease Control and Prevention, STEADI: https://garcia.biz/  [employer] on Aging: BrainJudge.co.uk Contact a doctor if:  You are afraid of falling at home.  You feel weak, drowsy, or dizzy at home.  You fall at home. Summary  There are many simple things that you can do to make your home safe and to help prevent falls.  Ways to make your home safe include removing tripping hazards and installing grab bars in the bathroom.  Ask for help when making these changes in your home. This information is not intended to replace advice given to you by your health care provider. Make sure you discuss any questions you have with your health care provider. Document Released: 06/30/2009 Document Revised: 12/25/2018 Document Reviewed: 04/18/2017 Elsevier Patient Education  2020 [employer].

## 2019-07-23 ENCOUNTER — Ambulatory Visit: Payer: Medicare Other | Admitting: Physical Therapy

## 2019-07-23 ENCOUNTER — Other Ambulatory Visit: Payer: Self-pay

## 2019-07-23 DIAGNOSIS — R2681 Unsteadiness on feet: Secondary | ICD-10-CM | POA: Diagnosis not present

## 2019-07-23 DIAGNOSIS — R2689 Other abnormalities of gait and mobility: Secondary | ICD-10-CM

## 2019-07-23 DIAGNOSIS — R296 Repeated falls: Secondary | ICD-10-CM

## 2019-07-23 LAB — CYTOLOGY - PAP
Comment: NEGATIVE
Diagnosis: NEGATIVE
High risk HPV: NEGATIVE

## 2019-07-23 NOTE — Therapy (Signed)
[facility] [address] [place], [place], [address] Phone: [phone]   Fax:  [phone]  Physical Therapy Treatment  Patient Details  Name: Meagan Schwartz MRN: [id number] Date of Birth: 1946/06/11 Referring Provider (PT): Hulan Fess. MD   Encounter Date: 07/23/2019  PT End of Session - 07/23/19 1451    Visit Number  5    Number of Visits  9    Date for PT Re-Evaluation  123XX123   Cert written for 40, however POC only 30 days   Authorization Type  Medicare/BCBS supplement-10th visit progress note needed.    PT Start Time  1400    PT Stop Time  1445    PT Time Calculation (min)  45 min    Equipment Utilized During Treatment  Gait belt    Activity Tolerance  Patient tolerated treatment well    Behavior During Therapy  WFL for tasks assessed/performed       Past Medical History:  Diagnosis Date  . Alzheimer disease ([name])   . Arthritis   . Hx of colonic polyps 12/27/2014  . Hyperlipidemia   . 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 Vitamin D deficiency     Past Surgical History:  Procedure Laterality Date  . BREAST BIOPSY Left 2016   benign  . COLONOSCOPY  10-04-2009  . VAGINAL DELIVERY     x2    There were no vitals filed for this visit.  Subjective Assessment - 07/23/19 1451    Subjective  Daughter present with pt today.  Denies falls or changes.  Pt denies pain.    Patient is accompained by:  Family member    Pertinent History  Alzheimer's Disease    Limitations  Walking    Patient Stated Goals  We want her to have better balance.                       [facility] Adult PT Treatment/Exercise - 07/23/19 0001      Therapeutic Activites    Therapeutic Activities  Other Therapeutic Activities    Other Therapeutic Activities  sit to stands no UE support from low mat table 2 sets of 10, stairs with cues to only use one UE support and reciprocal pattern up/down X 5 ea,       Neuro  Re-ed    Neuro Re-ed Details   mod tandem balance 30 sec X 2 bilat, retrowalking and lateral walking, walking with head turns vertical and horizontal in bars up/down X 3 ea, then in stepping into the step ladder in efforts to increased step length.      Exercises   Exercises  Other Exercises   Nu step for endurance 7 min UE/LE L3              PT Short Term Goals - 07/02/19 1516      PT SHORT TERM GOAL #1   Title  =LTGs due to POC        PT Long Term Goals - 07/02/19 1516      PT LONG TERM GOAL #1   Title  Pt will be compliant in performing HEP exercises with assist from husband in order to improve balance and functional mobility. (Target Date: 08/01/19)    Time  4    Period  Weeks    Status  New    Target Date  08/01/19      PT LONG TERM GOAL #2  Title  Pt will improve DGI score to >/=16/24 in order to indicate decreased fall risk.      PT LONG TERM GOAL #3   Title  Pt will improve gait speed to >/=2.56 ft/sec w/ LRAD in order to indicate safe community ambulation.      PT LONG TERM GOAL #4   Title  Pt/spouse will verbalize return to leisure ambulation in order to maintain gains made in functional mobility/endurance.            Plan - 07/23/19 1452    Clinical Impression Statement  Session focused on balance, gait with larger step length, and endurance/strengthening today. She has inconsistent performance with gait and at times can take larger steps but mostly reverts back to short wider steps with gait. PT will continue to progress as able but somewhat limited due to cognitive deficits.    Personal Factors and Comorbidities  Age;Comorbidity 1    Comorbidities  Alzheimer's Disease    Examination-Activity Limitations  Locomotion Level;Squat;Stairs;Stand    Examination-Participation Restrictions  Other   walking dogs   Stability/Clinical Decision Making  Stable/Uncomplicated    Rehab Potential  Fair    PT Frequency  2x / week    PT Duration  4 weeks    PT  Treatment/Interventions  ADLs/Self Care Home Management;Functional mobility training;Therapeutic activities;Gait training;DME Instruction;Stair training;Therapeutic exercise;Balance training;Neuromuscular re-education;Patient/family education    PT Next Visit Plan  gait for improved stride length and heel to toe contact, (treadmill was not very helpful on last visit with this) high level balance, she's tricky because she is higher level but due to cognition is difficult to progress.    Consulted and Agree with Plan of Care  Patient;Family member/caregiver    Family Member Consulted  daughter, [place]       Patient will benefit from skilled therapeutic intervention in order to improve the following deficits and impairments:  Abnormal gait, Decreased cognition, Decreased balance, Decreased knowledge of use of DME, Decreased knowledge of precautions, Decreased mobility, Postural dysfunction  Visit Diagnosis: Unsteadiness on feet  Repeated falls  Other abnormalities of gait and mobility     Problem List Patient Active Problem List   Diagnosis Date Noted  . Dementia of the Alzheimer's type, with late onset, with depressed mood ([place]) 10/14/2017  . Cognitive deficits 02/20/2017  . Postural dizziness with presyncope 02/20/2017  . Hx of colonic polyps 10/04/2009    Meagan Schwartz 07/23/2019, 2:55 PM  [facility] [address] [place], [place], [address] Phone: [phone]   Fax:  [phone]  Name: Meagan Schwartz MRN: [id number] Date of Birth: [birth year]

## 2019-07-27 ENCOUNTER — Encounter: Payer: Self-pay | Admitting: Rehabilitation

## 2019-07-27 ENCOUNTER — Other Ambulatory Visit: Payer: Self-pay

## 2019-07-27 ENCOUNTER — Ambulatory Visit: Payer: Medicare Other | Admitting: Rehabilitation

## 2019-07-27 DIAGNOSIS — R2689 Other abnormalities of gait and mobility: Secondary | ICD-10-CM

## 2019-07-27 DIAGNOSIS — R296 Repeated falls: Secondary | ICD-10-CM

## 2019-07-27 DIAGNOSIS — R2681 Unsteadiness on feet: Secondary | ICD-10-CM

## 2019-07-27 NOTE — Therapy (Signed)
[facility] [address] [place], [place], [address] Phone: [phone]   Fax:  [phone]  Physical Therapy Treatment  Patient Details  Name: Meagan Schwartz MRN: [id number] Date of Birth: [birth year] Referring Provider (PT): Hulan Fess. MD   Encounter Date: 07/27/2019  PT End of Session - 07/27/19 1521    Visit Number  6    Number of Visits  9    Date for PT Re-Evaluation  [id number]   Cert written for 68, however POC only 30 days   Authorization Type  Medicare/BCBS supplement-10th visit progress note needed.    PT Start Time  1315    PT Stop Time  1358    PT Time Calculation (min)  43 min    Equipment Utilized During Treatment  Gait belt    Activity Tolerance  Patient tolerated treatment well    Behavior During Therapy  WFL for tasks assessed/performed       Past Medical History:  Diagnosis Date  . Alzheimer disease ([place])   . Arthritis   . Hx of colonic polyps 12/27/2014  . Hyperlipidemia   . 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 Vitamin D deficiency     Past Surgical History:  Procedure Laterality Date  . BREAST BIOPSY Left 2016   benign  . COLONOSCOPY  10-04-2009  . VAGINAL DELIVERY     x2    There were no vitals filed for this visit.  Subjective Assessment - 07/27/19 1323    Subjective  Pt daughter reports near fall over the weekend.  Pt was fatigued and was also distracted, but daughter was able to assist.    Patient is accompained by:  Family member    Pertinent History  Alzheimer's Disease    Limitations  Walking    Currently in Pain?  No/denies                       [facility] Adult PT Treatment/Exercise - 07/27/19 1348      Ambulation/Gait   Ambulation/Gait  Yes    Ambulation/Gait Assistance  4: Min assist    Ambulation/Gait Assistance Details  Performed bout of gait on treadmill x 4 mins at 0.8 mph with BUE support in order to increase B stride length and improve  heel contact.  Pt unable to fully get large stride as her LEs would be behind body.  Discontinnued exercise before last minute in order to have her ambulate over land with PT.  PT lightly held pts left hand and PTs R hand was at pts posterior trunk in order to provide facilitation for increased gait speed and increased stride length.  Note MARKED improvement with this technique, therefore demo'd to daughter during session for improved carryover at home.      Ambulation Distance (Feet)  300 Feet   plus 4 mins on treadmill   Assistive device  None;Other (Comment)   treadmill   Gait Pattern  Step-to pattern;Step-through pattern;Decreased stride length;Right flexed knee in stance;Left flexed knee in stance;Shuffle;Trunk flexed;Narrow base of support    Ambulation Surface  Level;Indoor    Pre-Gait Activities  Performed gait/balance task to carryover to imroved stride length with stepping over orange obstacles forward x 4 reps (large and small barriers) followed by side stepping over barriers x 1 lap (this was very difficult from a cognitive standpoint).  Transitioned to stepping to targets on floor with R then LLE x 4 rounds of 3 reps.  Again, very difficult due to poor cognition.        Knee/Hip Exercises: Aerobic   Nustep  Level 3 with BUEs/LEs x 5 mins with cues for increased rpms throughout.              PT Education - 07/27/19 1520    Education Details  Education to daughter on how to ambulate safely with pt and that she will likely needs hands on assist to prevent falls and to encourage increased stride length.    Person(s) Educated  Patient;Child(ren)    Methods  Explanation;Demonstration    Comprehension  Verbalized understanding       PT Short Term Goals - 07/02/19 1516      PT SHORT TERM GOAL #1   Title  =LTGs due to POC        PT Long Term Goals - 07/02/19 1516      PT LONG TERM GOAL #1   Title  Pt will be compliant in performing HEP exercises with assist from husband in  order to improve balance and functional mobility. (Target Date: 08/01/19)    Time  4    Period  Weeks    Status  New    Target Date  08/01/19      PT LONG TERM GOAL #2   Title  Pt will improve DGI score to >/=16/24 in order to indicate decreased fall risk.      PT LONG TERM GOAL #3   Title  Pt will improve gait speed to >/=2.56 ft/sec w/ LRAD in order to indicate safe community ambulation.      PT LONG TERM GOAL #4   Title  Pt/spouse will verbalize return to leisure ambulation in order to maintain gains made in functional mobility/endurance.            Plan - 07/27/19 1522    Clinical Impression Statement  Skilled session focused on gait and high level balance to encourage increased step length during gait.  Also continue to focus on endurance and strengthening.  Pt continues to be limited by cognitive deficits.    Personal Factors and Comorbidities  Age;Comorbidity 1    Comorbidities  Alzheimer's Disease    Examination-Activity Limitations  Locomotion Level;Squat;Stairs;Stand    Examination-Participation Restrictions  Other   walking dogs   Stability/Clinical Decision Making  Stable/Uncomplicated    Rehab Potential  Fair    PT Frequency  2x / week    PT Duration  4 weeks    PT Treatment/Interventions  ADLs/Self Care Home Management;Functional mobility training;Therapeutic activities;Gait training;DME Instruction;Stair training;Therapeutic exercise;Balance training;Neuromuscular re-education;Patient/family education    PT Next Visit Plan  When walking, it was super helpful to hold her L hand with Pts L hand and place PTs R hand around back to encouarge increased gait speed and stride length),  high level balance, she's tricky because she is higher level but due to cognition is difficult to progress.    Consulted and Agree with Plan of Care  Patient;Family member/caregiver    Family Member Consulted  daughter, [place]       Patient will benefit from skilled therapeutic  intervention in order to improve the following deficits and impairments:  Abnormal gait, Decreased cognition, Decreased balance, Decreased knowledge of use of DME, Decreased knowledge of precautions, Decreased mobility, Postural dysfunction  Visit Diagnosis: Unsteadiness on feet  Repeated falls  Other abnormalities of gait and mobility     Problem List Patient Active Problem List   Diagnosis Date Noted  .  Dementia of the Alzheimer's type, with late onset, with depressed mood ([place]) 10/14/2017  . Cognitive deficits 02/20/2017  . Postural dizziness with presyncope 02/20/2017  . Hx of colonic polyps 10/04/2009    Cameron Sprang, PT, MPT [facility] [address] [place], [place], [address] Phone: [phone]   Fax:  [phone] 07/27/19, 3:26 PM  Name: TROYLYNN MALOY MRN: [id number] Date of Birth: Mar 04, 1946

## 2019-07-30 ENCOUNTER — Ambulatory Visit: Payer: Medicare Other | Admitting: Physical Therapy

## 2019-07-30 ENCOUNTER — Encounter: Payer: Self-pay | Admitting: Physical Therapy

## 2019-07-30 ENCOUNTER — Other Ambulatory Visit: Payer: Self-pay

## 2019-07-30 DIAGNOSIS — R2681 Unsteadiness on feet: Secondary | ICD-10-CM

## 2019-07-30 DIAGNOSIS — R2689 Other abnormalities of gait and mobility: Secondary | ICD-10-CM | POA: Diagnosis not present

## 2019-07-30 DIAGNOSIS — R296 Repeated falls: Secondary | ICD-10-CM

## 2019-08-02 NOTE — Therapy (Signed)
[facility] [address] [place], [place], [address] Phone: [phone]   Fax:  [phone]  Physical Therapy Treatment  Patient Details  Name: Meagan Schwartz MRN: [id number] Date of Birth: 07-16-1946 Referring Provider (PT): Hulan Fess. MD   Encounter Date: 07/30/2019   07/30/19 1453  PT Visits / Re-Eval  Visit Number 7  Number of Visits 9  Date for PT Re-Evaluation [id number] (Cert written for 60, however POC only 30 days)  Authorization  Authorization Type Medicare/BCBS supplement-10th visit progress note needed.  PT Time Calculation  PT Start Time 1448  PT Stop Time 1528  PT Time Calculation (min) 40 min  PT - End of Session  Equipment Utilized During Treatment Gait belt  Activity Tolerance Patient tolerated treatment well  Behavior During Therapy WFL for tasks assessed/performed     Past Medical History:  Diagnosis Date  . Alzheimer disease ([place])   . Arthritis   . Hx of colonic polyps 12/27/2014  . Hyperlipidemia   . 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 Vitamin D deficiency     Past Surgical History:  Procedure Laterality Date  . BREAST BIOPSY Left 2016   benign  . COLONOSCOPY  10-04-2009  . VAGINAL DELIVERY     x2    There were no vitals filed for this visit.     07/30/19 1452  Symptoms/Limitations  Subjective No new complaitns. No falls or pain to report.  Patient is accompained by: Family member (daughter)  Pertinent History Alzheimer's Disease  Limitations Walking  Patient Stated Goals We want her to have better balance.  Pain Assessment  Currently in Pain? No/denies      07/30/19 1457  Transfers  Transfers Sit to Stand;Stand to Sit  Sit to Stand 5: Supervision;4: Min guard  Stand to Sit 5: Supervision  Ambulation/Gait  Ambulation/Gait Yes  Ambulation/Gait Assistance 4: Min assist  Ambulation Distance (Feet) 230 Feet (x2)  Assistive device None;1 person hand held  assist  Gait Pattern Step-to pattern;Step-through pattern;Decreased stride length;Right flexed knee in stance;Left flexed knee in stance;Shuffle;Trunk flexed;Narrow base of support  Ambulation Surface Level;Indoor  High Level Balance  High Level Balance Activities Head turns;Negotiating over obstacles (speed changes)  High Level Balance Comments gait around track for 2 reps of 230 feet working on increased gait speed/increased step length, progressing to speed changes from fast to slow, then at a steady pace working on having pt scan all directions randomly. pt responded well to HHA on left with PTA left hand while right arm was wrapped around her back to facilitate increased gait speed; 4 foam bosters on floor- working on reciprocal stepping over them with visual, verbal and tactile cues for 4 laps, HHA on left with PTA right arm behind her to facilitate weight shifting with cues for "big steps".       Neuro Re-ed   Neuro Re-ed Details  use of floor ladder- working on reciprocal stepping with increased step length/height. verbal/visual/tactile cues needed for 6-7 laps over ladder with HHA for balance/facilitation for weight shifting.                  Knee/Hip Exercises: Aerobic  Nustep Level 4 with BUEs/LEs x 5 mins with cues for increased rpms throughout.       07/30/19 1503  Balance Exercises: Standing  SLS with Vectors Solid surface;Upper extremity assist 1;Other reps (comment);Limitations  Other Standing Exercises seated with feet across red beam: sit<>stands for 10  reps with  minimal UE assist, cues for full upright standing and slow, controlled descent with sitting down. min guard assist for balance.   Balance Exercises: Standing  SLS with Vectors Limitations 2 foam bubbles on floor, HHA on right side- alternating fwd foot taps, then alternating cross foot taps. cues on stance position/base of support, weight shifting and ex form/technique. pt responded well to PTA demo, then cues with her  performance on what was expected for her to do.          PT Short Term Goals - 07/02/19 1516      PT SHORT TERM GOAL #1   Title  =LTGs due to POC        PT Long Term Goals - 07/02/19 1516      PT LONG TERM GOAL #1   Title  Pt will be compliant in performing HEP exercises with assist from husband in order to improve balance and functional mobility. (Target Date: 08/01/19)    Time  4    Period  Weeks    Status  New    Target Date  08/01/19      PT LONG TERM GOAL #2   Title  Pt will improve DGI score to >/=16/24 in order to indicate decreased fall risk.      PT LONG TERM GOAL #3   Title  Pt will improve gait speed to >/=2.56 ft/sec w/ LRAD in order to indicate safe community ambulation.      PT LONG TERM GOAL #4   Title  Pt/spouse will verbalize return to leisure ambulation in order to maintain gains made in functional mobility/endurance.         07/30/19 1453  Plan  Clinical Impression Statement Today's skilled session continued to focus on gait and high level balance activities with emphasis on increased step length and increased step height with gait. Pt responed well to PTA demo either prior to her performance or concurrent with her performance. The pt is progressing, will need caregiver reinforcement for carryover outside of PT sessions.  Personal Factors and Comorbidities Age;Comorbidity 1  Comorbidities Alzheimer's Disease  Examination-Activity Limitations Locomotion Level;Squat;Stairs;Stand  Examination-Participation Restrictions Other (walking dogs)  Pt will benefit from skilled therapeutic intervention in order to improve on the following deficits Abnormal gait;Decreased cognition;Decreased balance;Decreased knowledge of use of DME;Decreased knowledge of precautions;Decreased mobility;Postural dysfunction  Stability/Clinical Decision Making Stable/Uncomplicated  Rehab Potential Fair  PT Frequency 2x / week  PT Duration 4 weeks  PT Treatment/Interventions  ADLs/Self Care Home Management;Functional mobility training;Therapeutic activities;Gait training;DME Instruction;Stair training;Therapeutic exercise;Balance training;Neuromuscular re-education;Patient/family education  PT Next Visit Plan begin to check pt's goals  Consulted and Agree with Plan of Care Patient;Family member/caregiver  Family Member Consulted daughter, [place]          Patient will benefit from skilled therapeutic intervention in order to improve the following deficits and impairments:  Abnormal gait, Decreased cognition, Decreased balance, Decreased knowledge of use of DME, Decreased knowledge of precautions, Decreased mobility, Postural dysfunction  Visit Diagnosis: Unsteadiness on feet  Repeated falls  Other abnormalities of gait and mobility     Problem List Patient Active Problem List   Diagnosis Date Noted  . Dementia of the Alzheimer's type, with late onset, with depressed mood ([place]) 10/14/2017  . Cognitive deficits 02/20/2017  . Postural dizziness with presyncope 02/20/2017  . Hx of colonic polyps 10/04/2009    Willow Ora, PTA, [facility] [address] [place], [place] [address] [phone] 08/02/19,  7:02 PM   Name: ALMETTA GERVIN MRN: [id number] Date of Birth: 11/29/1945

## 2019-08-03 ENCOUNTER — Encounter: Payer: Self-pay | Admitting: Rehabilitation

## 2019-08-03 ENCOUNTER — Ambulatory Visit: Payer: Medicare Other | Admitting: Rehabilitation

## 2019-08-03 ENCOUNTER — Other Ambulatory Visit: Payer: Self-pay

## 2019-08-03 DIAGNOSIS — R296 Repeated falls: Secondary | ICD-10-CM | POA: Diagnosis not present

## 2019-08-03 DIAGNOSIS — R2681 Unsteadiness on feet: Secondary | ICD-10-CM

## 2019-08-03 DIAGNOSIS — R2689 Other abnormalities of gait and mobility: Secondary | ICD-10-CM | POA: Diagnosis not present

## 2019-08-03 NOTE — Patient Instructions (Signed)
Marching In-Place    Stand beside counter top with one hand on counter for safety.  March forward to end of counter top picking up knees as high as you can and going SLOW!  Repeat back to starting point and do 4 reps each direction.    Copyright  VHI. All rights reserved.

## 2019-08-03 NOTE — Therapy (Signed)
[facility] [address] [place], [place], [address] Phone: [phone]   Fax:  [phone]  Physical Therapy Treatment  Patient Details  Name: Meagan Schwartz MRN: [id number] Date of Birth: 08-16-1946 Referring Provider (PT): Hulan Fess. MD   Encounter Date: 08/03/2019  PT End of Session - 08/03/19 1559    Visit Number  8    Number of Visits  9    Date for PT Re-Evaluation  84/13/24   Cert written for 16, however POC only 30 days   Authorization Type  Medicare/BCBS supplement-10th visit progress note needed.    PT Start Time  1318    PT Stop Time  1400    PT Time Calculation (min)  42 min    Equipment Utilized During Treatment  Gait belt    Activity Tolerance  Patient tolerated treatment well    Behavior During Therapy  WFL for tasks assessed/performed       Past Medical History:  Diagnosis Date  . Alzheimer disease ([place])   . Arthritis   . Hx of colonic polyps 12/27/2014  . Hyperlipidemia   . 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 Vitamin D deficiency     Past Surgical History:  Procedure Laterality Date  . BREAST BIOPSY Left 2016   benign  . COLONOSCOPY  10-04-2009  . VAGINAL DELIVERY     x2    There were no vitals filed for this visit.  Subjective Assessment - 08/03/19 1323    Subjective  No changes since last session.  Pts husband reports her walking seems better.    Patient is accompained by:  Family member    Pertinent History  Alzheimer's Disease    Limitations  Walking    Patient Stated Goals  We want her to have better balance.         [facility] PT Assessment - 08/03/19 1325      Functional Gait  Assessment   Gait assessed   Yes                   [facility] Adult PT Treatment/Exercise - 08/03/19 1337      Ambulation/Gait   Ambulation/Gait  Yes    Ambulation/Gait Assistance  4: Min assist    Ambulation/Gait Assistance Details  Min A for facilitation to encourage  increased gait speed and increased stride length.  Performed ambulation indoors and outdoors during session on paved surfaces with cues regarding incline changes for safety.      Ambulation Distance (Feet)  300 Feet   then 500' outdoors    Assistive device  None;1 person hand held assist    Gait Pattern  Step-to pattern;Step-through pattern;Decreased stride length;Right flexed knee in stance;Left flexed knee in stance;Shuffle;Trunk flexed;Narrow base of support    Ambulation Surface  Indoor;Level;Unlevel;Paved;Outdoor    Gait velocity  2.14 ft/sec without AD      Standardized Balance Assessment   Standardized Balance Assessment  Dynamic Gait Index      Dynamic Gait Index   Level Surface  Mild Impairment    Change in Gait Speed  Normal    Gait with Horizontal Head Turns  Mild Impairment    Gait with Vertical Head Turns  Mild Impairment    Gait and Pivot Turn  Mild Impairment    Step Over Obstacle  Moderate Impairment    Step Around Obstacles  Severe Impairment    Steps  Mild Impairment    Total Score  14      Self-Care   Self-Care  Other Self-Care Comments    Other Self-Care Comments   continue to discuss recommendations for gait with pt for exercise how we have practiced in therapy sessions for safety and for improved gait pattern.  She has made gains in therapy, but will need repetition at home to carryover.        Exercises   Exercises  Other Exercises    Other Exercises   marching at counter top x 4 laps with single UE support              PT Education - 08/03/19 1559    Education Details  Ambulation for exercise in way that we have instructed, added marching to HEP.    Person(s) Educated  Patient;Spouse    Methods  Explanation;Demonstration;Handout    Comprehension  Verbalized understanding;Returned demonstration;Verbal cues required;Tactile cues required       PT Short Term Goals - 07/02/19 1516      PT SHORT TERM GOAL #1   Title  =LTGs due to POC        PT  Long Term Goals - 08/03/19 1325      PT LONG TERM GOAL #1   Title  Pt will be compliant in performing HEP exercises with assist from husband in order to improve balance and functional mobility. (Target Date: 08/07/19)    Time  4    Period  Weeks    Status  New      PT LONG TERM GOAL #2   Title  Pt will improve DGI score to >/=16/24 in order to indicate decreased fall risk.    Baseline  improved from 12/24 to 14/24 on 08/03/2019    Status  Partially Met      PT LONG TERM GOAL #3   Title  Pt will improve gait speed to >/=2.56 ft/sec w/ LRAD in order to indicate safe community ambulation.    Baseline  2.14 ft/sec on 08/03/2019    Status  Partially Met      PT LONG TERM GOAL #4   Title  Pt/spouse will verbalize return to leisure ambulation in order to maintain gains made in functional mobility/endurance.    Baseline  Pt/spouse are walking, however they have verbalized that they will agree to begin ambulating how PT/PTA have discussed to increase stride length and gait speed.    Status  Partially Met            Plan - 08/03/19 1600    Clinical Impression Statement  Skilled session focused on goal assessment (all but HEP) since husband was present for session.  Pt has made very minimal progress towards goals due to poor cognition.  Continue to recommend that family ambulate with pt side by side in order to steady but also to reinforce improved gait speed/stride length.  Pt and husband verbalized understanding.    Personal Factors and Comorbidities  Age;Comorbidity 1    Comorbidities  Alzheimer's Disease    Examination-Activity Limitations  Locomotion Level;Squat;Stairs;Stand    Examination-Participation Restrictions  Other   walking dogs   Stability/Clinical Decision Making  Stable/Uncomplicated    Rehab Potential  Fair    PT Frequency  2x / week    PT Duration  4 weeks    PT Treatment/Interventions  ADLs/Self Care Home Management;Functional mobility training;Therapeutic  activities;Gait training;DME Instruction;Stair training;Therapeutic exercise;Balance training;Neuromuscular re-education;Patient/family education    PT Next Visit Plan  Go through HEP with daughter and maybe  have daughter actually ambulate with pt, send Em D/C.    Consulted and Agree with Plan of Care  Patient;Family member/caregiver    Family Member Consulted  daughter, [place]       Patient will benefit from skilled therapeutic intervention in order to improve the following deficits and impairments:  Abnormal gait, Decreased cognition, Decreased balance, Decreased knowledge of use of DME, Decreased knowledge of precautions, Decreased mobility, Postural dysfunction  Visit Diagnosis: Unsteadiness on feet  Repeated falls  Other abnormalities of gait and mobility     Problem List Patient Active Problem List   Diagnosis Date Noted  . Dementia of the Alzheimer's type, with late onset, with depressed mood ([place]) 10/14/2017  . Cognitive deficits 02/20/2017  . Postural dizziness with presyncope 02/20/2017  . Hx of colonic polyps 10/04/2009    Cameron Sprang, PT, MPT [facility] [address] [place], [place], [address] Phone: [phone]   Fax:  [phone] 08/03/19, 4:08 PM  Name: Meagan Schwartz MRN: [id number] Date of Birth: [birth year]

## 2019-08-06 ENCOUNTER — Ambulatory Visit: Payer: Medicare Other | Admitting: Physical Therapy

## 2019-08-06 ENCOUNTER — Other Ambulatory Visit: Payer: Self-pay

## 2019-08-06 ENCOUNTER — Encounter: Payer: Self-pay | Admitting: Physical Therapy

## 2019-08-06 DIAGNOSIS — R2681 Unsteadiness on feet: Secondary | ICD-10-CM

## 2019-08-06 DIAGNOSIS — R296 Repeated falls: Secondary | ICD-10-CM

## 2019-08-06 DIAGNOSIS — R2689 Other abnormalities of gait and mobility: Secondary | ICD-10-CM

## 2019-08-06 NOTE — Therapy (Addendum)
[facility] [address] [place], [place], [address] Phone: [phone]   Fax:  [phone]  Physical Therapy Treatment and D/C Summary   Patient Details  Name: Meagan Schwartz MRN: [id number] Date of Birth: 1946/06/29 Referring Provider (PT): Hulan Fess. MD   Encounter Date: 08/06/2019  PT End of Session - 08/06/19 1321    Visit Number  9    Number of Visits  9    Date for PT Re-Evaluation  08/67/61   Cert written for 105, however POC only 30 days   Authorization Type  Medicare/BCBS supplement-10th visit progress note needed.    PT Start Time  1315    PT Stop Time  1334   discharge visit-not all time was needed   PT Time Calculation (min)  19 min    Equipment Utilized During Treatment  Gait belt    Activity Tolerance  Patient tolerated treatment well    Behavior During Therapy  WFL for tasks assessed/performed       Past Medical History:  Diagnosis Date  . Alzheimer disease ([place])   . Arthritis   . Hx of colonic polyps 12/27/2014  . Hyperlipidemia   . 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 Vitamin D deficiency     Past Surgical History:  Procedure Laterality Date  . BREAST BIOPSY Left 2016   benign  . COLONOSCOPY  10-04-2009  . VAGINAL DELIVERY     x2    There were no vitals filed for this visit.  Subjective Assessment - 08/06/19 1320    Subjective  No new complaints. No falls or pain to report.    Patient is accompained by:  Family member   spouse   Pertinent History  Alzheimer's Disease    Limitations  Walking    Patient Stated Goals  We want her to have better balance.    Currently in Pain?  No/denies             [facility] Adult PT Treatment/Exercise - 08/06/19 1323      Transfers   Transfers  Sit to Stand;Stand to Sit    Sit to Stand  5: Supervision;With upper extremity assist;From bed;From chair/3-in-1    Stand to Sit  5: Supervision;With upper extremity assist;To bed;To  chair/3-in-1      Ambulation/Gait   Ambulation/Gait  Yes    Ambulation/Gait Assistance  4: Min guard    Ambulation/Gait Assistance Details  PTA demo'd technique of holding pt's left hand with my left hand with my right arm around pt's back. spouse verbalized/demo'd understanding.     Ambulation Distance (Feet)  500 Feet    Assistive device  None;1 person hand held assist    Gait Pattern  Step-to pattern;Step-through pattern;Decreased stride length;Right flexed knee in stance;Left flexed knee in stance;Shuffle;Trunk flexed;Narrow base of support    Ambulation Surface  Level;Indoor;Unlevel;Outdoor;Paved      Self-Care   Self-Care  Other Self-Care Comments    Other Self-Care Comments   spouse reports they are doing the OTAGO program split up so that they do 6 a day, alternating which they do to cover them all. He has no questions on these or the ones that were added at last session. Original plan was to teach these to the daughter today, however he brought pt to session. He stated he feels he can show their daughter how to assist the pt as needed with the ex's.  PT Short Term Goals - 07/02/19 1516      PT SHORT TERM GOAL #1   Title  =LTGs due to POC        PT Long Term Goals - 08/06/19 1339      PT LONG TERM GOAL #1   Title  Pt will be compliant in performing HEP exercises with assist from husband in order to improve balance and functional mobility. (Target Date: 08/07/19)    Baseline  08/06/19: pt's spouse states no issues with HEP. Feels he can show daughter how to assist pt as she did not attend today's session for training, he came instead.    Status  Achieved      PT LONG TERM GOAL #2   Title  Pt will improve DGI score to >/=16/24 in order to indicate decreased fall risk.    Baseline  improved from 12/24 to 14/24 on 08/03/2019    Status  Partially Met      PT LONG TERM GOAL #3   Title  Pt will improve gait speed to >/=2.56 ft/sec w/ LRAD in order to indicate safe  community ambulation.    Baseline  2.14 ft/sec on 08/03/2019    Status  Partially Met      PT LONG TERM GOAL #4   Title  Pt/spouse will verbalize return to leisure ambulation in order to maintain gains made in functional mobility/endurance.    Baseline  Pt/spouse are walking, however they have verbalized that they will agree to begin ambulating how PT/PTA have discussed to increase stride length and gait speed.    Status  Partially Met        PHYSICAL THERAPY DISCHARGE SUMMARY  Visits from Start of Care: 9  Current functional level related to goals / functional outcomes: See above   Remaining deficits: Pt continues to be limited due to poor cognition, however has made slow gains during therapy.     Education / Equipment: OTAGO HEP and education to family for hands on assist when ambulating   Plan: Patient agrees to discharge.  Patient goals were partially met. Patient is being discharged due to meeting the stated rehab goals.  ?????       D/C Summary done by:  Cameron Sprang, PT, MPT [facility] [address] [place], [place], [address] Phone: [phone]   Fax:  [phone] 08/06/19, 6:49 PM    Plan - 08/06/19 1321    Clinical Impression Statement  Today's skilled session focued on remaining LTG for anticipated discharge per PT plan. Spouse in attendance with pt today and without questions on HEP. PT had thought pt's daughter was coming and had planned to teach her how to assist with HEP. Pt's spouse stated he feels he can do this. Reviewed gait technique to facilitate large steps by holding pt's left hand with caregiver's left hand with right arm around her back. Spouse able to demonstrate this for increasing her gait speed. Also reinforced to stay with pt with gait for safety, even just holding onto her arm to help guide  her if he did not want to work on faster gait. Pt's spouse demo'd this technique with walking out of clinic  after session. Pt and spouse agreeable to discharge today.    Personal Factors and Comorbidities  Age;Comorbidity 1    Comorbidities  Alzheimer's Disease    Examination-Activity Limitations  Locomotion Level;Squat;Stairs;Stand    Examination-Participation Restrictions  Other   walking dogs   Stability/Clinical Decision Making  Stable/Uncomplicated    Rehab Potential  Fair    PT Frequency  2x / week    PT Duration  4 weeks    PT Treatment/Interventions  ADLs/Self Care Home Management;Functional mobility training;Therapeutic activities;Gait training;DME Instruction;Stair training;Therapeutic exercise;Balance training;Neuromuscular re-education;Patient/family education    PT Next Visit Plan  discharge per PT plan of care    Consulted and Agree with Plan of Care  Patient;Family member/caregiver    Family Member Consulted  daughter, [place]       Patient will benefit from skilled therapeutic intervention in order to improve the following deficits and impairments:  Abnormal gait, Decreased cognition, Decreased balance, Decreased knowledge of use of DME, Decreased knowledge of precautions, Decreased mobility, Postural dysfunction  Visit Diagnosis: Unsteadiness on feet  Repeated falls  Other abnormalities of gait and mobility     Problem List Patient Active Problem List   Diagnosis Date Noted  . Dementia of the Alzheimer's type, with late onset, with depressed mood ([place]) 10/14/2017  . Cognitive deficits 02/20/2017  . Postural dizziness with presyncope 02/20/2017  . Hx of colonic polyps 10/04/2009    Willow Ora, PTA, [facility] [address], [place], [place] [address] [phone] 08/06/19, 1:45 PM  Name: RECHY BOST MRN: [id number] Date of Birth: September 08, 1946

## 2019-08-29 ENCOUNTER — Other Ambulatory Visit: Payer: Self-pay | Admitting: Neurology

## 2019-08-29 DIAGNOSIS — R413 Other amnesia: Secondary | ICD-10-CM

## 2019-09-21 ENCOUNTER — Other Ambulatory Visit: Payer: Medicare Other

## 2019-09-21 ENCOUNTER — Ambulatory Visit: Payer: Medicare Other | Attending: Internal Medicine

## 2019-09-21 DIAGNOSIS — R238 Other skin changes: Secondary | ICD-10-CM

## 2019-09-21 DIAGNOSIS — U071 COVID-19: Secondary | ICD-10-CM

## 2019-09-22 LAB — NOVEL CORONAVIRUS, NAA: SARS-CoV-2, NAA: NOT DETECTED

## 2019-10-29 ENCOUNTER — Ambulatory Visit: Payer: Medicare Other | Attending: Internal Medicine

## 2019-10-29 DIAGNOSIS — Z20822 Contact with and (suspected) exposure to covid-19: Secondary | ICD-10-CM | POA: Diagnosis not present

## 2019-10-30 LAB — NOVEL CORONAVIRUS, NAA: SARS-CoV-2, NAA: NOT DETECTED

## 2019-11-21 ENCOUNTER — Other Ambulatory Visit: Payer: Self-pay | Admitting: Neurology

## 2019-11-21 DIAGNOSIS — R413 Other amnesia: Secondary | ICD-10-CM

## 2019-11-27 ENCOUNTER — Other Ambulatory Visit: Payer: Self-pay

## 2019-11-27 ENCOUNTER — Ambulatory Visit: Payer: Medicare Other | Attending: Family Medicine | Admitting: Physical Therapy

## 2019-11-27 DIAGNOSIS — R2689 Other abnormalities of gait and mobility: Secondary | ICD-10-CM | POA: Diagnosis not present

## 2019-11-27 DIAGNOSIS — R2681 Unsteadiness on feet: Secondary | ICD-10-CM | POA: Insufficient documentation

## 2019-11-27 NOTE — Therapy (Signed)
[facility] [address] [place], [place], [address] Phone: [phone]   Fax:  [phone]  Physical Therapy Evaluation  Patient Details  Name: Meagan Schwartz MRN: [id number] Date of Birth: 30-Oct-1945 Referring Provider (PT): Hulan Fess, MD   Encounter Date: 11/27/2019  PT End of Session - 11/27/19 2043    Visit Number  1    Number of Visits  5    Date for PT Re-Evaluation  01/26/20    Authorization Type  BCBS Medicare    PT Start Time  1318    PT Stop Time  1404    PT Time Calculation (min)  46 min    Equipment Utilized During Treatment  Gait belt    Activity Tolerance  Patient tolerated treatment well    Behavior During Therapy  [facility] for tasks assessed/performed   Reports she doesn't like the walker as it's "too big"      Past Medical History:  Diagnosis Date  . Alzheimer disease ([place])   . Arthritis   . Hx of colonic polyps 12/27/2014  . Hyperlipidemia   . 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 Vitamin D deficiency     Past Surgical History:  Procedure Laterality Date  . BREAST BIOPSY Left 2016   benign  . COLONOSCOPY  10-04-2009  . VAGINAL DELIVERY     x2    There were no vitals filed for this visit.   Subjective Assessment - 11/27/19 1321    Subjective  Daughter provides history:  Had some big changes in gait and balance issues since here last visit.  Would like to keep mom out walking as much as possible.  Daughter feels that the distance patient has been able to walk prior to "giving out" is less and less.  There have been more and more instances of near falls.  Family gives support at her L side, but wonder if any advice on best way to walk with her safely.  Would any mobility aids be appropriate?    Patient is accompained by:  Family member   Daughter   Patient Stated Goals  To give advice on walking safety and possibility of assistive device    Currently in Pain?  No/denies          [facility] PT Assessment - 11/27/19 1328      Assessment   Medical Diagnosis  History of falls    Referring Provider (PT)  Hulan Fess, MD    Onset Date/Surgical Date  11/02/19      Precautions   Precautions  Fall    Precaution Comments  Hx of Alzheimers; has 24 hour supervision      Balance Screen   Has the patient fallen in the past 6 months  Yes    How many times?  1   family was able to lower down   Has the patient had a decrease in activity level because of a fear of falling?   No    Is the patient reluctant to leave their home because of a fear of falling?   No      Home Social worker  Private residence    Living Arrangements  Spouse/significant other    Available Help at Discharge  Available 24 hours/day    Type of [place] to enter    [employer] of Steps  2    Entrance Stairs-Rails  None    Home Layout  Two level;Bed/bath upstairs    Alternate Level Stairs-Number of Steps  12    Alternate Level Stairs-Rails  Right    Home Equipment  None      Prior Function   Level of Independence  Needs assistance with ADLs;Needs assistance with gait    Leisure  Has enjoyed walking, but less walking due to winter weather, husband surgery, COVID (less options for places to walk)      Observation/Other Assessments   Focus on Therapeutic Outcomes (FOTO)   NA      Ambulation/Gait   Ambulation/Gait  Yes    Ambulation/Gait Assistance  4: Min guard    Ambulation Distance (Feet)  220 Feet   x 2   Assistive device  1 person hand held assist   trial of rollator   Gait Pattern  Step-through pattern;Step-to pattern;Decreased arm swing - right;Decreased step length - right;Decreased step length - left;Right flexed knee in stance;Left flexed knee in stance;Decreased dorsiflexion - right;Decreased dorsiflexion - left;Shuffle;Decreased trunk rotation;Narrow base of support;Poor foot clearance - left;Poor foot clearance - right     Gait velocity  25.13 sec = 1.3 ft/sec   with no device (HHA of PT), >33 sec with rollator   Pre-Gait Activities  Trialed rollator during eval to assess for possibility of assistive device use with family assistance.  Pt needs min cues to stay close to rollator, with therapist assisting with straight line steering of rollator.  With turns/negotiation of furniture/doorways, pt veers into furniture and doorway on L side, even with therapist assistance.      Gait Comments  200 ft in 3 minute walk test with HHA; approx 190 ft in 3 minutes walking with rollator.                Objective measurements completed on examination: See above findings.              PT Education - 11/27/19 2042    Education Details  REsults of eval, POC, including trying assistive device (rollator), but with knowledge that family will need to provide assistance for patient to be safe wtih rollator; it would likely be used as adjunct for longer distance gait outdoors once weather is nicer.    Person(s) Educated  Patient;Child(ren)    Methods  Explanation    Comprehension  Verbalized understanding          PT Long Term Goals - 11/27/19 2054      PT LONG TERM GOAL #1   Title  Pt/family will demonstrate understanding of appropriate use of rollator for outdoor gait, with family assistance.  TARGET 12/25/2019    Time  4    Period  Weeks    Status  New      PT LONG TERM GOAL #2   Title  Family will verbalize understanding of walking program for improved endurance for walking in neighborhood.    Time  4    Period  Weeks    Status  New             Plan - 11/27/19 2045    Clinical Impression Statement  Pt is a 74 year old female who presents to OPPT with history of falls, hx of Alzheimer's disease.  Daughter concerned about her overall decreasing fucntional mobility and gait distance over the past several months.  Pt's gait velocity today is 1.3 ft/sec, lower than her gait velocity when here for  therapy in fall of 2020.  Performed gait with HHA of therapist (as family provides at home) as well as trial of rollator today.  On straight line open spaces, pt does well with rollator in gym with assistance of therapist.  Daughter interested in this option, as pt likes to walk outdoors, but her overall stamina and stability with gait is progressively limited.  Pt will benefit from skilled PT to address education and trials with rollator for potential family assistance with gait.    Personal Factors and Comorbidities  Comorbidity 2    Comorbidities  Alzheimer's, arthritis    Examination-Activity Limitations  Locomotion Level;Transfers;Stand    Examination-Participation Restrictions  Community Activity;Other   Walking in neighborhood   Stability/Clinical Decision Making  Stable/Uncomplicated    Clinical Decision Making  Low    Rehab Potential  Fair   poor cognition, but good family support   PT Frequency  1x / week    PT Duration  4 weeks    PT Treatment/Interventions  Gait training;ADLs/Self Care Home Management;Functional mobility training;Therapeutic activities;Therapeutic exercise;Patient/family education;Balance training;Neuromuscular re-education    PT Next Visit Plan  Trial of rollator for outdoor gait with family assistance/education (ask for order if it is going to work); ask about OTAGO-if pt is still doing that at home as part of HEP for strength/balance    Consulted and Agree with Plan of Care  Patient;Family member/caregiver    Family Member Consulted  Daughter, [place]       Patient will benefit from skilled therapeutic intervention in order to improve the following deficits and impairments:  Abnormal gait, Difficulty walking, Decreased balance, Decreased mobility, Decreased strength, Impaired flexibility  Visit Diagnosis: Other abnormalities of gait and mobility  Unsteadiness on feet     Problem List Patient Active Problem List   Diagnosis Date Noted  . Dementia of the  Alzheimer's type, with late onset, with depressed mood ([place]) 10/14/2017  . Cognitive deficits 02/20/2017  . Postural dizziness with presyncope 02/20/2017  . Hx of colonic polyps 10/04/2009    Darneisha Windhorst W. 11/27/2019, 8:57 PM  Frazier Butt., PT   [facility] [address] [place], [place], [address] Phone: [phone]   Fax:  [phone]  Name: IDALIZ PENNACCHIO MRN: [id number] Date of Birth: [birth year]

## 2019-12-01 ENCOUNTER — Other Ambulatory Visit: Payer: Self-pay

## 2019-12-01 ENCOUNTER — Ambulatory Visit: Payer: Medicare Other | Admitting: Physical Therapy

## 2019-12-01 DIAGNOSIS — R2689 Other abnormalities of gait and mobility: Secondary | ICD-10-CM

## 2019-12-01 DIAGNOSIS — R2681 Unsteadiness on feet: Secondary | ICD-10-CM | POA: Diagnosis not present

## 2019-12-01 NOTE — Patient Instructions (Signed)
  Rollator walker  -[employer]- ([place])  -[facility] [address]  -[place] ([place])

## 2019-12-01 NOTE — Therapy (Signed)
[facility] [address] [place], [place], [address] Phone: [phone]   Fax:  [phone]  Physical Therapy Treatment  Patient Details  Name: Meagan Schwartz MRN: [id number] Date of Birth: June 08, 1946 Referring Provider (PT): Hulan Fess, MD   Encounter Date: 12/01/2019  PT End of Session - 12/01/19 1913    Visit Number  2    Number of Visits  5    Date for PT Re-Evaluation  01/26/20    Authorization Type  BCBS Medicare    PT Start Time  1319    PT Stop Time  1402    PT Time Calculation (min)  43 min    Equipment Utilized During Treatment  Gait belt    Activity Tolerance  Patient tolerated treatment well    Behavior During Therapy  [facility] for tasks assessed/performed;Flat affect   Reports she doesn't like the walker as it's "too big"      Past Medical History:  Diagnosis Date  . Alzheimer disease ([place])   . Arthritis   . Hx of colonic polyps 12/27/2014  . Hyperlipidemia   . 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 Vitamin D deficiency     Past Surgical History:  Procedure Laterality Date  . BREAST BIOPSY Left 2016   benign  . COLONOSCOPY  10-04-2009  . VAGINAL DELIVERY     x2    There were no vitals filed for this visit.  Subjective Assessment - 12/01/19 1323    Subjective  Daughter present again today; she reports that patient and husband are doing OTAGO (except tandem and toe walking).  Daughter and pt's husband will split up visits and are agreeable to trying rollator walker again.    Patient is accompained by:  Family member   Daughter   Patient Stated Goals  To give advice on walking safety and possibility of assistive device    Currently in Pain?  No/denies                       [facility] Adult PT Treatment/Exercise - 12/01/19 0001      Transfers   Transfers  Sit to Stand;Stand to Sit    Sit to Stand  With upper extremity assist;From chair/3-in-1;5: Supervision    Stand to Sit   4: Min guard;With upper extremity assist;To chair/3-in-1    Transfer Cueing  Cues for full turn to sit, to back up to feel chair on legs, to reach back with hands to sit.    Comments  Cues provided for role of rollator during transfer.  Ultimately, since pt will be using rollator for longer distance walking and PT/daughter are concerned about walker sequence disrupting her current transfer technique, daughter was instructed in making sure rollator brakes are locked for when pt stands to use; when preparing to sit, to lock and leave rollator, then have pt walk short distance to chair with family supervision (no rollator)      Ambulation/Gait   Ambulation/Gait  Yes    Ambulation/Gait Assistance  4: Min guard    Ambulation Distance (Feet)  230 Feet   300, 120 ft   Assistive device  Rollator    Gait Pattern  Step-through pattern;Decreased arm swing - right;Decreased step length - right;Decreased step length - left;Right flexed knee in stance;Left flexed knee in stance;Decreased dorsiflexion - right;Decreased dorsiflexion - left;Shuffle;Decreased trunk rotation;Narrow base of support;Poor foot clearance - left;Poor foot clearance - right  Ambulation Surface  Level;Indoor    Pre-Gait Activities  Educated daughter on hand placement on gait belt and on walker to help with pacing of gait.  Daughter has concerns about L shoulder pain (that she has from assisting pt with near fall).  Discussed and practiced having daugther at pt's R side, but ultimately daughter feels more comfortable on pt's left side.    Gait Comments  PT provides assistance at pt's left side, minimal assistance on rollator to slow it down and to keep rollator closer to patient.  Occasional cues for upright posture and increased step length (pt maintains her gait pattern without; no changes with cues).       Pt needs additional assistance maneuvering rollator through doorway and in hallway with furniture, PT provides assistance to  maneuver rollator more towards right, to avoid furniture on L side.    PT assists with locking brakes and provides VCs for pt to turn and sit.  Pt performs turning to sit with min guard assistance.  She is able to push up to stand with RUE support, with min guard assist, then turn to face rollator.  PT unlocks brakes.  Daughter present to observe.  Discussed how rollator folds, weight of rollator (approx 18#), in regards to putting in and out of car.  Daughter feels this would be doable.  Discussed finding flat, level surfaces that pt/family will be able to use for walking for exercise with rollator.      PT Education - 12/01/19 1912    Education Details  Discussed options for ordering/obtaining rollator.  Daughter agreeable to PT requesting order from MD for rollator so they can get one for patient and train in therapy sessions wtih pt's own rollator    Person(s) Educated  Patient;Child(ren)    Methods  Explanation    Comprehension  Verbalized understanding          PT Long Term Goals - 11/27/19 2054      PT LONG TERM GOAL #1   Title  Pt/family will demonstrate understanding of appropriate use of rollator for outdoor gait, with family assistance.  TARGET 12/25/2019    Time  4    Period  Weeks    Status  New      PT LONG TERM GOAL #2   Title  Family will verbalize understanding of walking program for improved endurance for walking in neighborhood.    Time  4    Period  Weeks    Status  New            Plan - 12/01/19 1914    Clinical Impression Statement  Gait training today in PT session with rollator walker.  Instructed daughter in positioning beside patient, with hand on rollator for pacing, as well as locking/unlocking brakes for transfers, and use of rollator as seat for rest break with gait.  Daugther return demo understanding; pt will benefit from further practice with rollator, as her husband will be present next session, and ultimately daughter/husband will be  providing assistance for the rollator with patient, due to pt's cognition.  She demonstrates good step length and posture, no LOB or fatigue noted with giat during session.    Personal Factors and Comorbidities  Comorbidity 2    Comorbidities  Alzheimer's, arthritis    Examination-Activity Limitations  Locomotion Level;Transfers;Stand    Examination-Participation Restrictions  Community Activity;Other   Walking in neighborhood   Stability/Clinical Decision Making  Stable/Uncomplicated    Rehab Potential  Fair  poor cognition, but good family support   PT Frequency  1x / week    PT Duration  4 weeks    PT Treatment/Interventions  Gait training;ADLs/Self Care Home Management;Functional mobility training;Therapeutic activities;Therapeutic exercise;Patient/family education;Balance training;Neuromuscular re-education    PT Next Visit Plan  [place] gait training wtih use of rollator:  family member at L side, with use of gait belt and hand on rollator for pacing.  Try outdoors if weather permits. Cues and instruction in braking for sitting and for transfers    Recommended Other Services  PT to request order from Dr. Rex Kras for Rollator    Consulted and Agree with Plan of Care  Patient;Family member/caregiver    Family Member Consulted  Daughter, [place]       Patient will benefit from skilled therapeutic intervention in order to improve the following deficits and impairments:  Abnormal gait, Difficulty walking, Decreased balance, Decreased mobility, Decreased strength, Impaired flexibility  Visit Diagnosis: Other abnormalities of gait and mobility     Problem List Patient Active Problem List   Diagnosis Date Noted  . Dementia of the Alzheimer's type, with late onset, with depressed mood ([place]) 10/14/2017  . Cognitive deficits 02/20/2017  . Postural dizziness with presyncope 02/20/2017  . Hx of colonic polyps 10/04/2009    Arthella Headings W. 12/01/2019, 7:19 PM  Frazier Butt.,  PT   [facility] [address] [place], [place], [address] Phone: [phone]   Fax:  [phone]  Name: TERRIS GIDDENS MRN: [id number] Date of Birth: [birth year]

## 2019-12-04 ENCOUNTER — Telehealth: Payer: Self-pay | Admitting: Physical Therapy

## 2019-12-04 NOTE — Telephone Encounter (Signed)
Meagan Schwartz has been seen by PT for several visits, where we have trialed use of rollator walker.  Daughter has been present for these sessions for initial training with rollator walker.  Pt may benefit from use of rollator walker for longer distance walking to provide upper extremity support and stability as well as option for safety (for a seat), should pt need rest breaks.  Daughter is in agreement to proceed with requesting order for rollator walker.  If you agree, could you please write order for rollator walker for Meagan Schwartz?  Thank you.  Mady Haagensen, PT 12/04/19 2:41 PM Phone: [phone] Fax: [phone]

## 2019-12-08 ENCOUNTER — Encounter: Payer: Self-pay | Admitting: Certified Nurse Midwife

## 2019-12-10 ENCOUNTER — Other Ambulatory Visit: Payer: Self-pay

## 2019-12-10 ENCOUNTER — Ambulatory Visit: Payer: Medicare Other | Admitting: Physical Therapy

## 2019-12-10 DIAGNOSIS — R2689 Other abnormalities of gait and mobility: Secondary | ICD-10-CM

## 2019-12-10 DIAGNOSIS — R2681 Unsteadiness on feet: Secondary | ICD-10-CM | POA: Diagnosis not present

## 2019-12-10 NOTE — Patient Instructions (Signed)
Rollator walker  -[employer]- ([place])  -[facility] [address]  -[facility] ([place] near [place])

## 2019-12-11 NOTE — Therapy (Signed)
[facility] [address] [place], [place], [address] Phone: [phone]   Fax:  [phone]  Physical Therapy Treatment  Patient Details  Name: Meagan Schwartz MRN: [id number] Date of Birth: 1946/04/27 Referring Provider (PT): Hulan Fess, MD   Encounter Date: 12/10/2019  PT End of Session - 12/11/19 1850    Visit Number  3    Number of Visits  5    Date for PT Re-Evaluation  01/26/20    Authorization Type  BCBS Medicare    PT Start Time  [id number]    PT Stop Time  1614    PT Time Calculation (min)  41 min    Equipment Utilized During Treatment  Gait belt    Activity Tolerance  Patient tolerated treatment well    Behavior During Therapy  [facility] for tasks assessed/performed;Flat affect   Reports she doesn't like the walker as it's "too big"      Past Medical History:  Diagnosis Date  . Alzheimer disease ([place])   . Arthritis   . Hx of colonic polyps 12/27/2014  . Hyperlipidemia   . 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 Vitamin D deficiency     Past Surgical History:  Procedure Laterality Date  . BREAST BIOPSY Left 2016   benign  . COLONOSCOPY  10-04-2009  . VAGINAL DELIVERY     x2    There were no vitals filed for this visit.  Subjective Assessment - 12/10/19 1536    Subjective  Husband reports that she seems a little more unsteady today.    Patient is accompained by:  Family member   Husband, [name]   Patient Stated Goals  To give advice on walking safety and possibility of assistive device    Currently in Pain?  No/denies                       [facility] Adult PT Treatment/Exercise - 12/11/19 0001      Transfers   Transfers  Sit to Stand;Stand to Sit    Sit to Stand  With upper extremity assist;From chair/3-in-1;5: Supervision    Stand to Sit  4: Min guard;With upper extremity assist;To chair/3-in-1   Cues to fully turn around to sit and to reach for chair   Transfer Cueing  In  addition to cues, pt needs physical assistance to turn fully, as she does not complete full turn to sit    Comments  With husband present for first time instruction with rollator:  Cues provided for role of rollator during transfer.  Ultimately, since pt will be using rollator for longer distance walking and PT is concerned about walker sequence disrupting her current transfer technique, husband was instructed in making sure rollator brakes are locked for when pt stands to use; when preparing to sit, to lock and leave rollator, then have pt walk short distance to chair with family supervision (no rollator)      Ambulation/Gait   Ambulation/Gait  Yes    Ambulation/Gait Assistance  4: Min guard    Ambulation Distance (Feet)  230 Feet   indoors; additional outdoor gait-see below   Assistive device  Rollator    Gait Pattern  Step-through pattern;Decreased arm swing - right;Decreased step length - right;Decreased step length - left;Right flexed knee in stance;Left flexed knee in stance;Decreased dorsiflexion - right;Decreased dorsiflexion - left;Shuffle;Decreased trunk rotation;Narrow base of support;Poor foot clearance - left;Poor foot clearance - right;Step-to pattern  Ambulation Surface  Level;Indoor;Outdoor;Paved    Gait Comments  PT provides assistance at pt's left side, minimal assistance on rollator to slow it down and to keep rollator closer to patient.  Occasional cues for upright posture and increased step length (pt maintains her gait pattern without; no changes with cues).        Pt needs additional assistance maneuvering rollator through doorway and in hallway with furniture, PT provides assistance to maneuver rollator more towards right, to avoid furniture and wall on L side.    Additional gait walking out of gym, through automatic doorways and on sidewalk, including gentle incline/decline.  PT provides minimal assistance and cues to husband, allowing him to assist with patient as well,  to slow descent (mild increase) down gentle incline of sidewalk.  PT assists with locking brakes of rollator and provides VCs for pt to turn and sit on rollator seat.  Pt performs turning to sit with min guard assistance.  She is able to push up to stand with RUE support, with min guard assist, then turn to face rollator.  PT unlocks brakes.  Husband present to observe.      PT Education - 12/11/19 1848    Education Details  Discussed options for ordering/obtaining rollator.  Discussed benefits of/safety concerns with rollator and husband/family need to assist with maneuvering of rollator for optimal safety for distance gait for exercise    Person(s) Educated  Patient;Spouse    Methods  Explanation;Demonstration;Verbal cues    Comprehension  Verbalized understanding;Returned demonstration          PT Long Term Goals - 11/27/19 2054      PT LONG TERM GOAL #1   Title  Pt/family will demonstrate understanding of appropriate use of rollator for outdoor gait, with family assistance.  TARGET 12/25/2019    Time  4    Period  Weeks    Status  New      PT LONG TERM GOAL #2   Title  Family will verbalize understanding of walking program for improved endurance for walking in neighborhood.    Time  4    Period  Weeks    Status  New            Plan - 12/11/19 1851    Clinical Impression Statement  Provided similar training today for use of rollator walker as last week, with husband present today.  Gait using rollator on indoor surfaces with instructions to husband on hand placement for pacing rollator and providing support to patient.  Cues for locking/unlocking brakes and for safety during trasnfers.  Also performed gait on outdoor surfaces with gentle incline, with education provided on how to safely maintain speed of walker.  Overall, pt does not appear unsafe with use of rollator, but WILL NEED family guidance and assistance with rollator if she will be using it for walking for  exercise.    Personal Factors and Comorbidities  Comorbidity 2    Comorbidities  Alzheimer's, arthritientl    Examination-Activity Limitations  Locomotion Level;Transfers;Stand    Examination-Participation Restrictions  Community Activity;Other   Walking in neighborhood   Stability/Clinical Decision Making  Stable/Uncomplicated    Rehab Potential  Fair   poor cognition, but good family support   PT Frequency  1x / week    PT Duration  4 weeks    PT Treatment/Interventions  Gait training;ADLs/Self Care Home Management;Functional mobility training;Therapeutic activities;Therapeutic exercise;Patient/family education;Balance training;Neuromuscular re-education    PT Next Visit Plan  [place] gait  training wtih use of rollator:  family member at L side, with use of gait belt and hand on rollator for pacing.  Try outdoors if weather permits. Cues and instruction in braking for sitting and for transfers    Consulted and Agree with Plan of Care  Patient;Family member/caregiver    Family Member Consulted  Daughter, [place]       Patient will benefit from skilled therapeutic intervention in order to improve the following deficits and impairments:  Abnormal gait, Difficulty walking, Decreased balance, Decreased mobility, Decreased strength, Impaired flexibility  Visit Diagnosis: Other abnormalities of gait and mobility  Unsteadiness on feet     Problem List Patient Active Problem List   Diagnosis Date Noted  . Dementia of the Alzheimer's type, with late onset, with depressed mood ([place]) 10/14/2017  . Cognitive deficits 02/20/2017  . Postural dizziness with presyncope 02/20/2017  . Hx of colonic polyps 10/04/2009    [place] Mom W. 12/11/2019, 7:00 PM  Frazier Butt., PT   [facility] [address] [place], [place], [address] Phone: [phone]   Fax:  [phone]  Name: Meagan Schwartz MRN: [id number] Date of Birth:  1946/03/23

## 2019-12-23 ENCOUNTER — Other Ambulatory Visit: Payer: Self-pay

## 2019-12-23 ENCOUNTER — Ambulatory Visit: Payer: Medicare Other | Attending: Family Medicine | Admitting: Physical Therapy

## 2019-12-23 DIAGNOSIS — R2689 Other abnormalities of gait and mobility: Secondary | ICD-10-CM | POA: Diagnosis not present

## 2019-12-23 DIAGNOSIS — R2681 Unsteadiness on feet: Secondary | ICD-10-CM | POA: Diagnosis not present

## 2019-12-24 ENCOUNTER — Encounter: Payer: Self-pay | Admitting: Physical Therapy

## 2019-12-24 ENCOUNTER — Ambulatory Visit: Payer: Medicare Other | Admitting: Adult Health

## 2019-12-24 ENCOUNTER — Encounter: Payer: Self-pay | Admitting: Adult Health

## 2019-12-24 VITALS — BP 124/72 | HR 75 | Temp 97.4°F | Ht 64.0 in | Wt 115.0 lb

## 2019-12-24 DIAGNOSIS — G301 Alzheimer's disease with late onset: Secondary | ICD-10-CM | POA: Diagnosis not present

## 2019-12-24 DIAGNOSIS — F0280 Dementia in other diseases classified elsewhere without behavioral disturbance: Secondary | ICD-10-CM | POA: Diagnosis not present

## 2019-12-24 NOTE — Patient Instructions (Signed)
Your Plan:  Continue Namenda and Aricept If your symptoms worsen or you develop new symptoms please let us know.      Thank you for coming to see [place] at [facility]. I hope we have been able to provide you high quality care today.  You may receive a patient satisfaction survey over the next few weeks. We would appreciate your feedback and comments so that we may continue to improve ourselves and the health of our patients.

## 2019-12-24 NOTE — Progress Notes (Addendum)
PATIENT: Meagan Schwartz DOB: [birth year]  REASON FOR VISIT: follow up HISTORY FROM: patient  HISTORY OF PRESENT ILLNESS: Today 12/24/19:  Meagan Schwartz is a 74 year old female with a history of Alzheimer's disease.  She returns today for follow-up.  She remains on Aricept and Namenda.  Her husband feels that her memory has declined significantly.  He has to help her with all ADLs.  Reports good appetite.  Although she is now eating with her hands.  Denies any changes in mood or behavior.  No hallucinations.  Reports that she is sleeping well is up 1-2 times to urinate.  She [facility].  Returns today for an evaluation  HISTORY 06/25/19 :  Meagan Schwartz is a 74 year old female with a history of Alzheimer's disease.  She returns today for follow-up.  She is here today with her husband.  She remains on Aricept and Namenda.  She tolerates the medication well.  Her husband reports that she needs some supervision with ADLs.  She does not operate a motor vehicle.  She no longer handles any of the finances.  She does not cook on the stove anymore.  No change in her mood or behavior.  Denies any hallucinations.  She returns today for evaluation.   REVIEW OF SYSTEMS: Out of a complete 14 system review of symptoms, the patient complains only of the following symptoms, and all other reviewed systems are negative.  See HPI  ALLERGIES: Allergies  Allergen Reactions  . Clindamycin     HOME MEDICATIONS: Outpatient Medications Prior to Visit  Medication Sig Dispense Refill  . donepezil (ARICEPT) 10 MG tablet TAKE 1 TABLET BY MOUTH AT BEDTIME 90 tablet 0  . levothyroxine (SYNTHROID, LEVOTHROID) 75 MCG tablet Take 1 tablet by mouth daily.    . Melatonin 5 MG TABS Take 1 tablet by mouth once.    . memantine (NAMENDA) 10 MG tablet Take 1 tablet (10 mg total) by mouth 2 (two) times daily. 180 tablet 3  . multivitamin-lutein (OCUVITE-LUTEIN) CAPS capsule Take 1 capsule by mouth  daily.    Marland Kitchen UNABLE TO FIND 1 tablet daily at 2 PM. Med Name:"Re-Code"     No facility-administered medications prior to visit.    PAST MEDICAL HISTORY: Past Medical History:  Diagnosis Date  . Alzheimer disease ([place])   . Arthritis   . Hx of colonic polyps 12/27/2014  . Hyperlipidemia   . 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 Vitamin D deficiency     PAST SURGICAL HISTORY: Past Surgical History:  Procedure Laterality Date  . BREAST BIOPSY Left 2016   benign  . COLONOSCOPY  10-04-2009  . VAGINAL DELIVERY     x2    FAMILY HISTORY: Family History  Problem Relation Age of Onset  . Colon cancer Mother   . Heart disease Mother   . Alzheimer's disease Father   . Heart disease Father   . Leukemia Brother   . Breast cancer Maternal Grandmother   . Breast cancer Paternal Grandmother   . Paget's disease of bone Cousin   . Paget's disease of bone Cousin   . Polycythemia Brother   . Graves' disease Daughter   . Graves' disease Son   . Rectal cancer Neg Hx   . Stomach cancer Neg Hx     SOCIAL HISTORY: Social History   Socioeconomic History  . Marital status: Married    Spouse name: [name]  . Number of children: 2  .  Years of education: Not on file  . Highest education level: Bachelor's degree (e.g., BA, AB, BS)  Occupational History  . Occupation: retired  Tobacco Use  . Smoking status: Former Research scientist (life sciences)  . Smokeless tobacco: Never Used  . Tobacco comment: smoked while in college  Substance and Sexual Activity  . Alcohol use: Not Currently  . Drug use: No  . Sexual activity: Not Currently    Partners: Male    Birth control/protection: Post-menopausal  Other Topics Concern  . Not on file  Social History Narrative   Lives at home with her husband and two dogs   Right handed   About 1 cup of tea every 2 weeks    Social Determinants of Health   Financial Resource Strain:   . Difficulty of Paying Living Expenses:   Food Insecurity:   . Worried About Paediatric nurse in the Last Year:   . Arboriculturist in the Last Year:   Transportation Needs:   . Film/video editor (Medical):   Marland Kitchen Lack of Transportation (Non-Medical):   Physical Activity:   . Days of Exercise per Week:   . Minutes of Exercise per Session:   Stress:   . Feeling of Stress :   Social Connections:   . Frequency of Communication with Friends and Family:   . Frequency of Social Gatherings with Friends and Family:   . Attends Religious Services:   . Active Member of Clubs or Organizations:   . Attends Archivist Meetings:   Marland Kitchen Marital Status:   Intimate Partner Violence:   . Fear of Current or Ex-Partner:   . Emotionally Abused:   Marland Kitchen Physically Abused:   . Sexually Abused:       PHYSICAL EXAM  Vitals:   12/24/19 1424  BP: 124/72  Pulse: 75  Temp: (!) 97.4 F (36.3 C)  Weight: 115 lb (52.2 kg)  Height: 5\' 4"  (1.626 m)   Body mass index is 19.74 kg/m.   MMSE - Mini Mental State Exam 12/24/2019 06/25/2019 06/17/2018  Not completed: - - (No Data)  Orientation to time 0 1 3  Orientation to Place 2 2 2   Registration 3 3 3   Attention/ Calculation 0 0 1  Recall 0 0 0  Language- name 2 objects 0 2 2  Language- repeat 1 1 1   Language- follow 3 step command 3 2 2   Language- read & follow direction 0 1 1  Write a sentence 0 0 1  Copy design 0 0 0  Copy design-comments - named 3 animals -  Total score 9 12 16       Generalized: Well developed, in no acute distress   Neurological examination  Mentation: Alert oriented to person follows all commands speech and language fluent Cranial nerve II-XII: Pupils were equal round reactive to light. Extraocular movements were full, visual field were full on confrontational test. Facial sensation and strength were normal. Uvula tongue midline. Head turning and shoulder shrug  were normal and symmetric. Motor: The motor testing reveals 5 over 5 strength of all 4 extremities. Good symmetric motor tone is noted  throughout.  Sensory: Sensory testing is intact to soft touch on all 4 extremities. No evidence of extinction is noted.  Coordination: Cerebellar testing reveals good finger-nose-finger and heel-to-shin bilaterally.  Gait and station: Gait is slightly unsteady.  Decreased stride   DIAGNOSTIC DATA (LABS, IMAGING, TESTING) - I reviewed patient records, labs, notes, testing and imaging myself where available.  Lab Results  Component Value Date   WBC 7.2 07/16/2018   HGB 13.5 07/16/2018   HCT 39.5 07/16/2018   MCV 91 07/16/2018   PLT 252 07/16/2018      ASSESSMENT AND PLAN 74 y.o. year old female  has a past medical history of Alzheimer disease ([place]), Arthritis, colonic polyps (12/27/2014), Hyperlipidemia, Thyroid disease, and Vitamin D deficiency. here with :  1.  Memory disturbance  -MMSE 9/30 -Continue Aricept and Namenda for now -Continue monitoring symptoms -Follow-up in 1 year or sooner if needed   I spent 25 minutes of face-to-face and non-face-to-face time with patient.  This included previsit chart review, lab review, study review, order entry, electronic health record documentation, patient education.  Ward Givens, MSN, NP-C 12/24/2019, 2:39 PM [facility] [address] [place], [place] [address] [phone]  Made any corrections needed, and agree with history, physical, neuro exam,assessment and plan as stated.     Sarina Ill, MD [facility]

## 2019-12-24 NOTE — Therapy (Signed)
[facility] [address] [place], [place], [address] Phone: [phone]   Fax:  [phone]  Physical Therapy Treatment  Patient Details  Name: Meagan Schwartz MRN: [id number] Date of Birth: September 01, 1946 Referring Provider (PT): Hulan Fess, MD   Encounter Date: 12/23/2019  PT End of Session - 12/24/19 0833    Visit Number  4    Number of Visits  5    Date for PT Re-Evaluation  01/26/20    Authorization Type  BCBS Medicare    PT Start Time  1708    PT Stop Time  1754    PT Time Calculation (min)  46 min    Equipment Utilized During Treatment  Gait belt    Activity Tolerance  Patient tolerated treatment well    Behavior During Therapy  [facility] for tasks assessed/performed;Flat affect       Past Medical History:  Diagnosis Date  . Alzheimer disease ([place])   . Arthritis   . Hx of colonic polyps 12/27/2014  . Hyperlipidemia   . 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 Vitamin D deficiency     Past Surgical History:  Procedure Laterality Date  . BREAST BIOPSY Left 2016   benign  . COLONOSCOPY  10-04-2009  . VAGINAL DELIVERY     x2    There were no vitals filed for this visit.  Subjective Assessment - 12/24/19 0816    Subjective  Daughter present and reports that she feels her walking is slowing.  They have purchased rollator similar to what we've used in clinic and bring it in today for adjustments and further training.    Patient is accompained by:  Family member   Husband, [name]   Patient Stated Goals  To give advice on walking safety and possibility of assistive device    Currently in Pain?  No/denies                       [facility] Adult PT Treatment/Exercise - 12/23/19 1710      Transfers   Transfers  Sit to Stand;Stand to Sit    Sit to Stand  With upper extremity assist;From chair/3-in-1;5: Supervision    Sit to Stand Details  Tactile cues for sequencing;Tactile cues for placement     Sit to Stand Details (indicate cue type and reason)  Cues for hand placement on locked rollator upon standing    Stand to Sit  4: Min guard;With upper extremity assist;To chair/3-in-1;4: Min assist    Stand to Sit Details (indicate cue type and reason)  Tactile cues for sequencing;Tactile cues for placement    Stand to Sit Details  Cues to fully turn and for hand placement to sit    Comments  Daughter has questions and concerns about use of 3:1 commode, with armrails, which is new to patient at home.  Practiced specifically using BUE support on rails of chair, to push up to stand, and to reach back to sit, simulating rails on 3:1 toilet seat at home.  PT provides manual cues for placement of LUE.      Ambulation/Gait   Ambulation/Gait  Yes    Ambulation/Gait Assistance  4: Min guard;4: Min assist    Ambulation/Gait Assistance Details  Adjusted pt's rollator walker by increasing handle height x 2 notches.  After walking outdoors, PT notes that rollator is malaligned (on bottom adjustments, rollator was one notch shorter on L side-PT made adjustments to fully  align    Ambulation Distance (Feet)  120 Feet   500 ft additional outdoor gait-see notes below   Assistive device  Rollator    Gait Pattern  Step-through pattern;Decreased step length - right;Decreased step length - left;Right flexed knee in stance;Left flexed knee in stance;Decreased dorsiflexion - right;Decreased dorsiflexion - left;Shuffle;Narrow base of support;Poor foot clearance - left;Poor foot clearance - right;Step-to pattern    Ambulation Surface  Level;Indoor    Gait Comments  PT provides cues for patient to "pick up feet" or "big steps", to which pt responds with improved foot clearance >50% of times with cues.       Gait training education provided to patient's daughter, as she brought in new rollator walker.  PT instructed in transfers to stand, cues for hand placement on handles, daughter to unlock brakes.  PT instructed in use  of gait belt, including daughter's hand placement for safety.  Daughter initially stands on pt's left side, for gait out of clinic, into parking lot (at least 300 ft).  PT provides cues for daughter's support at rollator to slow pace of gait.  PT assists at R side, as pt is veering to L in doorways, turns, and on straight gait area.  Daughter able to assist with locking brakes and cueing patient correctly to turn to sit at locked rollator seat.  Then, daughter assists patient on R side, walking back into the clinic, assisting pt to keep rollator close and to slow rollator pace.  PT provides supervision and occasional cues to patient for increased step length.  Daughter demonstrates understanding of correct support and assistance needed during transfers and gait using rollator walker.  Educated daughter to move rollator out of the way prior to pt walking last few feet to prepare to turn and sit at chair.  (*Note that pt did not veer as much to L side when daughter provided support/assistance on R side).  Also, note, that walker was adjusted for proper height and alignment (noted above) after outdoor gait.      PT Education - 12/24/19 [phone]    Education Details  Education with daughter on use of rollator walker with patient; safety education, use of gait belt/hand placement    Person(s) Educated  Patient;Child(ren)   daughter   Methods  Explanation;Demonstration;Verbal cues    Comprehension  Verbalized understanding;Returned demonstration          PT Long Term Goals - 11/27/19 2054      PT LONG TERM GOAL #1   Title  Pt/family will demonstrate understanding of appropriate use of rollator for outdoor gait, with family assistance.  TARGET 12/25/2019    Time  4    Period  Weeks    Status  New      PT LONG TERM GOAL #2   Title  Family will verbalize understanding of walking program for improved endurance for walking in neighborhood.    Time  4    Period  Weeks    Status  New             Plan - 12/24/19 [id number]    Clinical Impression Statement  Pt and daughter bring in her own rollator walker to this session; PT makes adjustments to height of rollator, for improved posture and safety with rollator.  Further education provided to daughter using rollator on indoor and outdoor surfaces, with daughter demonstrating overall understanding of safety with use of rollator.  PT provides cues throughout for education for safe use of rollator.  Husband to be present next visit for final additional training in use of rollator (for outdoor walking for exercise purposes).    Personal Factors and Comorbidities  Comorbidity 2    Comorbidities  Alzheimer's, arthritis    Examination-Activity Limitations  Locomotion Level;Transfers;Stand    Examination-Participation Restrictions  Community Activity;Other   Walking in neighborhood   Stability/Clinical Decision Making  Stable/Uncomplicated    Rehab Potential  Fair   poor cognition, but good family support   PT Frequency  1x / week    PT Duration  4 weeks    PT Treatment/Interventions  Gait training;ADLs/Self Care Home Management;Functional mobility training;Therapeutic activities;Therapeutic exercise;Patient/family education;Balance training;Neuromuscular re-education    PT Next Visit Plan  Husband to be present next visit for gait training with rollator-outdoor surfaces, use of gait belt and hand on rollator for pacing.  Cues for safety with transfers.  Per daughter report, pt may also need training on transfers (simulating 3:1 toilet seat at home)    Consulted and Agree with Plan of Care  Patient;Family member/caregiver    Family Member Consulted  Daughter, [place]       Patient will benefit from skilled therapeutic intervention in order to improve the following deficits and impairments:  Abnormal gait, Difficulty walking, Decreased balance, Decreased mobility, Decreased strength, Impaired flexibility  Visit Diagnosis: Other  abnormalities of gait and mobility  Unsteadiness on feet     Problem List Patient Active Problem List   Diagnosis Date Noted  . Dementia of the Alzheimer's type, with late onset, with depressed mood ([place]) 10/14/2017  . Cognitive deficits 02/20/2017  . Postural dizziness with presyncope 02/20/2017  . Hx of colonic polyps 10/04/2009    Larhonda Dettloff W. 12/24/2019, 8:41 AM  Frazier Butt., PT   [facility] [address] [place], [place], [address] Phone: [phone]   Fax:  [phone]  Name: Meagan Schwartz MRN: [id number] Date of Birth: June 17, 1946

## 2019-12-25 ENCOUNTER — Ambulatory Visit: Payer: Medicare Other | Admitting: Rehabilitation

## 2019-12-29 ENCOUNTER — Other Ambulatory Visit: Payer: Self-pay

## 2019-12-29 ENCOUNTER — Ambulatory Visit: Payer: Medicare Other | Admitting: Physical Therapy

## 2019-12-29 DIAGNOSIS — R2681 Unsteadiness on feet: Secondary | ICD-10-CM | POA: Diagnosis not present

## 2019-12-29 DIAGNOSIS — R2689 Other abnormalities of gait and mobility: Secondary | ICD-10-CM | POA: Diagnosis not present

## 2019-12-29 NOTE — Therapy (Signed)
[facility] [address] [place], [place], [address] Phone: [phone]   Fax:  [phone]  Physical Therapy Treatment/Discharge Summary  Patient Details  Name: Meagan Schwartz MRN: [id number] Date of Birth: 1945/10/18 Referring Provider (PT): Hulan Fess, MD   Encounter Date: 12/29/2019  PT End of Session - 12/29/19 1445    Visit Number  5    Number of Visits  5    Date for PT Re-Evaluation  01/26/20    Authorization Type  BCBS Medicare    PT Start Time  1232    PT Stop Time  1306   Last session today for d/c; pt/husband have all questions answered   PT Time Calculation (min)  34 min    Equipment Utilized During Treatment  Gait belt    Activity Tolerance  Patient tolerated treatment well    Behavior During Therapy  [facility] for tasks assessed/performed;Flat affect       Past Medical History:  Diagnosis Date  . Alzheimer disease ([place])   . Arthritis   . Hx of colonic polyps 12/27/2014  . Hyperlipidemia   . 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 Vitamin D deficiency     Past Surgical History:  Procedure Laterality Date  . BREAST BIOPSY Left 2016   benign  . COLONOSCOPY  10-04-2009  . VAGINAL DELIVERY     x2    There were no vitals filed for this visit.  Subjective Assessment - 12/29/19 1237    Subjective  Husband present today and reports daughter has taken her for a few walks with the walker, no problems.    Patient is accompained by:  Family member   Husband, [name]   Patient Stated Goals  To give advice on walking safety and possibility of assistive device    Currently in Pain?  No/denies                       [facility] Adult PT Treatment/Exercise - 12/29/19 0001      Transfers   Transfers  Sit to Stand;Stand to Sit    Sit to Stand  4: Min guard;With upper extremity assist;From chair/3-in-1    Sit to Stand Details  Tactile cues for sequencing;Tactile cues for placement    Sit to  Stand Details (indicate cue type and reason)  Cues for hand placement on locked rollato upon standing.    Stand to Sit  4: Min guard;With upper extremity assist;To chair/3-in-1;4: Min assist    Stand to Sit Details (indicate cue type and reason)  Tactile cues for sequencing;Tactile cues for placement    Stand to Sit Details  Cues to fully turn to sit    Comments  Husband demonstrates understanding of locking brakes and assisting pt to turn to sit at locked rollator seat.  Educated husband on helping pt to fully turn to sit (he uses good cueing for patient to fully turn around) and tactile assistance for L hand placement on chair armrest.      Ambulation/Gait   Ambulation/Gait  Yes    Ambulation/Gait Assistance  4: Min guard;4: Min assist    Ambulation Distance (Feet)  500 Feet   50 ft x 2 indoors with rollator   Assistive device  Rollator    Gait Pattern  Step-through pattern;Decreased step length - right;Decreased step length - left;Right flexed knee in stance;Left flexed knee in stance;Decreased dorsiflexion - right;Decreased dorsiflexion - left;Shuffle;Narrow base of support;Poor foot  clearance - left;Poor foot clearance - right;Step-to pattern    Ambulation Surface  Level;Indoor;Unlevel;Outdoor;Paved    Pre-Gait Activities  Educated husband on hand placement, brake locking/unlocking; hand placement on rollator to slow pace of gait.  Husband tends to stay to pt's L side due to his own shoulder injury.  He demonstrates good awareness of safety, cueing and staying close to pt during outdoor gait.  Educated husband on cueing pt to stop occasionally to reposition rollator if pt is getting too far in front.      Gait Comments  PT provides cues for patient to "pick up feet" or "big steps", to which pt responds with improved foot clearance >50% of times with cues.             PT Education - 12/29/19 1444    Education Details  Review of education with husband on use of rollator walker, safety  education; plans for d/c this visit    Person(s) Educated  Patient;Spouse    Methods  Demonstration;Verbal cues    Comprehension  Verbalized understanding;Returned demonstration          PT Long Term Goals - 12/29/19 1457      PT LONG TERM GOAL #1   Title  Pt/family will demonstrate understanding of appropriate use of rollator for outdoor gait, with family assistance.  TARGET 12/25/2019    Baseline  daughter and husband demo understanding of use of rollator, 12/29/2019    Time  4    Period  Weeks    Status  Achieved      PT LONG TERM GOAL #2   Title  Family will verbalize understanding of walking program for improved endurance for walking in neighborhood.    Baseline  daughter has been already taking pt on short walks with rollator, 12/29/2019    Time  4    Period  Weeks    Status  Achieved            Plan - 12/29/19 1445    Clinical Impression Statement  Pt's husband brings in rollator walker for session, with PT providing follow-up education to husband on safety with rollator.  Husband (and daughter in previous session) demonstrates good understanding of assistance for rollator with transfers and gait on outdoor surfaces, with LTGs being met.  He verbalizes understanding of appropriate assistance and use of rollator for walking for exercise.  Pt is appropriate for d/c this visit.    Personal Factors and Comorbidities  Comorbidity 2    Comorbidities  Alzheimer's, arthritis    Examination-Activity Limitations  Locomotion Level;Transfers;Stand    Examination-Participation Restrictions  Community Activity;Other   Walking in neighborhood   Stability/Clinical Decision Making  Stable/Uncomplicated    Rehab Potential  Fair   poor cognition, but good family support   PT Frequency  1x / week    PT Duration  4 weeks    PT Treatment/Interventions  Gait training;ADLs/Self Care Home Management;Functional mobility training;Therapeutic activities;Therapeutic exercise;Patient/family  education;Balance training;Neuromuscular re-education    PT Next Visit Plan  Discharge this visit.    Consulted and Agree with Plan of Care  Patient;Family member/caregiver    Family Member Consulted  Daughter, [place]       Patient will benefit from skilled therapeutic intervention in order to improve the following deficits and impairments:  Abnormal gait, Difficulty walking, Decreased balance, Decreased mobility, Decreased strength, Impaired flexibility  Visit Diagnosis: Other abnormalities of gait and mobility     Problem List Patient Active Problem List  Diagnosis Date Noted  . Dementia of the Alzheimer's type, with late onset, with depressed mood ([place]) 10/14/2017  . Cognitive deficits 02/20/2017  . Postural dizziness with presyncope 02/20/2017  . Hx of colonic polyps 10/04/2009    Roni Friberg W. 12/29/2019, 2:59 PM  Frazier Butt., PT   [facility] [address] [place], [place], [address] Phone: [phone]   Fax:  [phone]  Name: Meagan Schwartz MRN: [id number] Date of Birth: 12-13-1945   PHYSICAL THERAPY DISCHARGE SUMMARY  Visits from Start of Care: 4  Current functional level related to goals / functional outcomes: PT Long Term Goals - 12/29/19 1457      PT LONG TERM GOAL #1   Title  Pt/family will demonstrate understanding of appropriate use of rollator for outdoor gait, with family assistance.  TARGET 12/25/2019    Baseline  daughter and husband demo understanding of use of rollator, 12/29/2019    Time  4    Period  Weeks    Status  Achieved      PT LONG TERM GOAL #2   Title  Family will verbalize understanding of walking program for improved endurance for walking in neighborhood.    Baseline  daughter has been already taking pt on short walks with rollator, 12/29/2019    Time  4    Period  Weeks    Status  Achieved      Pt has met 2 of 2 LTGs.  Pt's family has demonstrated understanding  of use of rollator for walking for exercise with patient.   Remaining deficits: Decreased safety awareness, hx of falls.     Education / Equipment: Family educated each visit on use of rollator walker, how to appropriately assist pt using rollator walker and that use of rollator is for distance walking outdoors to allow for walking for exercise and that family will need to provide all assistance for rollator, due to pt's cognitive deficits.  Daughter and husband verbalize/demo understnading.   Plan: Patient agrees to discharge.  Patient goals were met. Patient is being discharged due to meeting the stated rehab goals.  ?????         Mady Haagensen, PT 12/29/19 3:07 PM Phone: [phone] Fax: [phone]

## 2020-01-01 ENCOUNTER — Encounter: Payer: Self-pay | Admitting: Adult Health

## 2020-01-31 ENCOUNTER — Encounter: Payer: Self-pay | Admitting: Adult Health

## 2020-02-02 MED ORDER — CITALOPRAM HYDROBROMIDE 10 MG PO TABS: 10.0000 mg | ORAL_TABLET | Freq: Every day | ORAL | 11 refills | Status: DC

## 2020-02-20 ENCOUNTER — Other Ambulatory Visit: Payer: Self-pay | Admitting: Adult Health

## 2020-02-20 DIAGNOSIS — R413 Other amnesia: Secondary | ICD-10-CM

## 2020-03-05 ENCOUNTER — Inpatient Hospital Stay (HOSPITAL_COMMUNITY)
Admission: EM | Admit: 2020-03-05 | Discharge: 2020-03-08 | DRG: 872 | Disposition: A | Discharge status: Home Health | Payer: Medicare Other | Attending: Family Medicine | Admitting: Family Medicine

## 2020-03-05 ENCOUNTER — Emergency Department (HOSPITAL_COMMUNITY): Payer: Medicare Other

## 2020-03-05 DIAGNOSIS — N390 Urinary tract infection, site not specified: Secondary | ICD-10-CM | POA: Diagnosis not present

## 2020-03-05 DIAGNOSIS — M1990 Unspecified osteoarthritis, unspecified site: Secondary | ICD-10-CM | POA: Diagnosis not present

## 2020-03-05 DIAGNOSIS — G9340 Encephalopathy, unspecified: Secondary | ICD-10-CM

## 2020-03-05 DIAGNOSIS — I351 Nonrheumatic aortic (valve) insufficiency: Secondary | ICD-10-CM | POA: Diagnosis not present

## 2020-03-05 DIAGNOSIS — Z79890 Hormone replacement therapy (postmenopausal): Secondary | ICD-10-CM | POA: Diagnosis not present

## 2020-03-05 DIAGNOSIS — Z87891 Personal history of nicotine dependence: Secondary | ICD-10-CM

## 2020-03-05 DIAGNOSIS — B9620 Unspecified Escherichia coli [E. coli] as the cause of diseases classified elsewhere: Secondary | ICD-10-CM | POA: Diagnosis present

## 2020-03-05 DIAGNOSIS — Z881 Allergy status to other antibiotic agents status: Secondary | ICD-10-CM | POA: Diagnosis not present

## 2020-03-05 DIAGNOSIS — Z820 Family history of epilepsy and other diseases of the nervous system: Secondary | ICD-10-CM | POA: Diagnosis not present

## 2020-03-05 DIAGNOSIS — A419 Sepsis, unspecified organism: Secondary | ICD-10-CM

## 2020-03-05 DIAGNOSIS — Z20822 Contact with and (suspected) exposure to covid-19: Secondary | ICD-10-CM | POA: Diagnosis present

## 2020-03-05 DIAGNOSIS — E559 Vitamin D deficiency, unspecified: Secondary | ICD-10-CM | POA: Diagnosis not present

## 2020-03-05 DIAGNOSIS — R509 Fever, unspecified: Secondary | ICD-10-CM | POA: Diagnosis not present

## 2020-03-05 DIAGNOSIS — F0280 Dementia in other diseases classified elsewhere without behavioral disturbance: Secondary | ICD-10-CM | POA: Diagnosis not present

## 2020-03-05 DIAGNOSIS — R778 Other specified abnormalities of plasma proteins: Secondary | ICD-10-CM | POA: Diagnosis not present

## 2020-03-05 DIAGNOSIS — Z86010 Personal history of colonic polyps: Secondary | ICD-10-CM | POA: Diagnosis not present

## 2020-03-05 DIAGNOSIS — Z79899 Other long term (current) drug therapy: Secondary | ICD-10-CM

## 2020-03-05 DIAGNOSIS — J90 Pleural effusion, not elsewhere classified: Secondary | ICD-10-CM | POA: Diagnosis not present

## 2020-03-05 DIAGNOSIS — F32A Depression, unspecified: Secondary | ICD-10-CM | POA: Diagnosis present

## 2020-03-05 DIAGNOSIS — F329 Major depressive disorder, single episode, unspecified: Secondary | ICD-10-CM | POA: Diagnosis present

## 2020-03-05 DIAGNOSIS — R531 Weakness: Secondary | ICD-10-CM | POA: Diagnosis not present

## 2020-03-05 DIAGNOSIS — R404 Transient alteration of awareness: Secondary | ICD-10-CM | POA: Diagnosis not present

## 2020-03-05 DIAGNOSIS — I10 Essential (primary) hypertension: Secondary | ICD-10-CM | POA: Diagnosis not present

## 2020-03-05 DIAGNOSIS — E785 Hyperlipidemia, unspecified: Secondary | ICD-10-CM | POA: Diagnosis not present

## 2020-03-05 DIAGNOSIS — J9811 Atelectasis: Secondary | ICD-10-CM | POA: Diagnosis not present

## 2020-03-05 DIAGNOSIS — R6521 Severe sepsis with septic shock: Secondary | ICD-10-CM | POA: Diagnosis not present

## 2020-03-05 DIAGNOSIS — I517 Cardiomegaly: Secondary | ICD-10-CM | POA: Diagnosis not present

## 2020-03-05 DIAGNOSIS — E039 Hypothyroidism, unspecified: Secondary | ICD-10-CM

## 2020-03-05 DIAGNOSIS — G301 Alzheimer's disease with late onset: Secondary | ICD-10-CM | POA: Diagnosis present

## 2020-03-05 DIAGNOSIS — A4151 Sepsis due to Escherichia coli [E. coli]: Secondary | ICD-10-CM | POA: Diagnosis not present

## 2020-03-05 LAB — URINALYSIS, ROUTINE W REFLEX MICROSCOPIC
Bilirubin Urine: NEGATIVE
Glucose, UA: NEGATIVE mg/dL
Hgb urine dipstick: NEGATIVE
Ketones, ur: NEGATIVE mg/dL
Nitrite: POSITIVE — AB
Protein, ur: 30 mg/dL — AB
Specific Gravity, Urine: 1.013 (ref 1.005–1.030)
pH: 6 (ref 5.0–8.0)

## 2020-03-05 LAB — CBC WITH DIFFERENTIAL/PLATELET
Abs Immature Granulocytes: 0.07 10*3/uL (ref 0.00–0.07)
Basophils Absolute: 0 10*3/uL (ref 0.0–0.1)
Basophils Relative: 0 %
Eosinophils Absolute: 0 10*3/uL (ref 0.0–0.5)
Eosinophils Relative: 0 %
HCT: 40.2 % (ref 36.0–46.0)
Hemoglobin: 13.4 g/dL (ref 12.0–15.0)
Immature Granulocytes: 1 %
Lymphocytes Relative: 2 %
Lymphs Abs: 0.3 10*3/uL — ABNORMAL LOW (ref 0.7–4.0)
MCH: 30.5 pg (ref 26.0–34.0)
MCHC: 33.3 g/dL (ref 30.0–36.0)
MCV: 91.6 fL (ref 80.0–100.0)
Monocytes Absolute: 0.9 10*3/uL (ref 0.1–1.0)
Monocytes Relative: 7 %
Neutro Abs: 11.2 10*3/uL — ABNORMAL HIGH (ref 1.7–7.7)
Neutrophils Relative %: 90 %
Platelets: 210 10*3/uL (ref 150–400)
RBC: 4.39 MIL/uL (ref 3.87–5.11)
RDW: 12.6 % (ref 11.5–15.5)
WBC: 12.5 10*3/uL — ABNORMAL HIGH (ref 4.0–10.5)
nRBC: 0 % (ref 0.0–0.2)

## 2020-03-05 LAB — COMPREHENSIVE METABOLIC PANEL
ALT: 17 U/L (ref 0–44)
AST: 24 U/L (ref 15–41)
Albumin: 3.5 g/dL (ref 3.5–5.0)
Alkaline Phosphatase: 56 U/L (ref 38–126)
Anion gap: 9 (ref 5–15)
BUN: 17 mg/dL (ref 8–23)
CO2: 24 mmol/L (ref 22–32)
Calcium: 9.1 mg/dL (ref 8.9–10.3)
Chloride: 101 mmol/L (ref 98–111)
Creatinine, Ser: 0.87 mg/dL (ref 0.44–1.00)
GFR calc Af Amer: >60 mL/min (ref >60)
GFR calc non Af Amer: >60 mL/min (ref >60)
Glucose, Bld: 126 mg/dL — ABNORMAL HIGH (ref 70–99)
Potassium: 4.1 mmol/L (ref 3.5–5.1)
Sodium: 134 mmol/L — ABNORMAL LOW (ref 135–145)
Total Bilirubin: 0.6 mg/dL (ref 0.3–1.2)
Total Protein: 6.6 g/dL (ref 6.5–8.1)

## 2020-03-05 LAB — SARS CORONAVIRUS 2 BY RT PCR (HOSPITAL ORDER, PERFORMED IN ~~LOC~~ HOSPITAL LAB): SARS Coronavirus 2: NEGATIVE

## 2020-03-05 LAB — TROPONIN I (HIGH SENSITIVITY): Troponin I (High Sensitivity): 45 ng/L — ABNORMAL HIGH (ref <18)

## 2020-03-05 LAB — PROTIME-INR
INR: 1.1 (ref 0.8–1.2)
Prothrombin Time: 13.4 seconds (ref 11.4–15.2)

## 2020-03-05 LAB — LACTIC ACID, PLASMA
Lactic Acid, Venous: 0.7 mmol/L (ref 0.5–1.9)
Lactic Acid, Venous: 0.7 mmol/L (ref 0.5–1.9)

## 2020-03-05 MED ORDER — SODIUM CHLORIDE 0.9 % IV BOLUS
500.0000 mL | Freq: Once | INTRAVENOUS | Status: AC
  Administered 2020-03-05: 500 mL via INTRAVENOUS

## 2020-03-05 MED ORDER — SODIUM CHLORIDE 0.9 % IV SOLN: 1.0000 g | INTRAVENOUS | Status: DC

## 2020-03-05 MED ORDER — ACETAMINOPHEN 650 MG RE SUPP: 650.0000 mg | Freq: Four times a day (QID) | RECTAL | Status: DC | PRN

## 2020-03-05 MED ORDER — MEMANTINE HCL 10 MG PO TABS
10.0000 mg | ORAL_TABLET | Freq: Two times a day (BID) | ORAL | Status: DC
  Administered 2020-03-06 – 2020-03-08 (×6): 10 mg via ORAL
  Filled 2020-03-05 (×7): qty 1

## 2020-03-05 MED ORDER — ACETAMINOPHEN 325 MG PO TABS
650.0000 mg | ORAL_TABLET | Freq: Four times a day (QID) | ORAL | Status: DC | PRN
  Administered 2020-03-06: 650 mg via ORAL
  Filled 2020-03-05: qty 2

## 2020-03-05 MED ORDER — PROSIGHT PO TABS
1.0000 | ORAL_TABLET | Freq: Every day | ORAL | Status: DC
  Administered 2020-03-06 – 2020-03-08 (×3): 1 via ORAL
  Filled 2020-03-05 (×4): qty 1

## 2020-03-05 MED ORDER — SODIUM CHLORIDE 0.9 % IV SOLN
1.0000 g | Freq: Once | INTRAVENOUS | Status: AC
  Administered 2020-03-05: 1 g via INTRAVENOUS
  Filled 2020-03-05: qty 10

## 2020-03-05 MED ORDER — ENOXAPARIN SODIUM 40 MG/0.4ML ~~LOC~~ SOLN
40.0000 mg | SUBCUTANEOUS | Status: DC
  Administered 2020-03-06 – 2020-03-08 (×3): 40 mg via SUBCUTANEOUS
  Filled 2020-03-05 (×3): qty 0.4

## 2020-03-05 MED ORDER — CITALOPRAM HYDROBROMIDE 20 MG PO TABS
10.0000 mg | ORAL_TABLET | Freq: Every day | ORAL | Status: DC
  Administered 2020-03-06 – 2020-03-08 (×3): 10 mg via ORAL
  Filled 2020-03-05 (×3): qty 1

## 2020-03-05 MED ORDER — ACETAMINOPHEN 325 MG PO TABS
650.0000 mg | ORAL_TABLET | Freq: Once | ORAL | Status: AC
  Administered 2020-03-05: 650 mg via ORAL
  Filled 2020-03-05: qty 2

## 2020-03-05 MED ORDER — LEVOTHYROXINE SODIUM 75 MCG PO TABS
75.0000 ug | ORAL_TABLET | Freq: Every day | ORAL | Status: DC
  Administered 2020-03-07 – 2020-03-08 (×2): 75 ug via ORAL
  Filled 2020-03-05 (×3): qty 1

## 2020-03-05 MED ORDER — DONEPEZIL HCL 10 MG PO TABS
10.0000 mg | ORAL_TABLET | Freq: Every day | ORAL | Status: DC
  Administered 2020-03-06 – 2020-03-07 (×3): 10 mg via ORAL
  Filled 2020-03-05 (×3): qty 1

## 2020-03-05 MED ORDER — SODIUM CHLORIDE 0.9 % IV SOLN: INTRAVENOUS | Status: AC

## 2020-03-05 NOTE — H&P (Signed)
History and Physical    Meagan Schwartz ZOX:[id number] DOB: Sep 26, 1945 DOA: 03/05/2020  PCP: Hulan Fess, MD Patient coming from: Home  Chief Complaint: Shaking  HPI: Meagan Schwartz is a 74 y.o. female with medical history significant of advanced Alzheimer's dementia (oriented to self only at baseline), hyperlipidemia, hypothyroidism presenting to the ED via EMS for evaluation of shaking and possible fever.  No family available at this time. Husband reported to ED provider that patient has advanced dementia and is oriented to self only at baseline.  She was a bit more lethargic than normal this morning.  This morning he witnessed an episode of shaking all over which lasted approximately 30 minutes.  Patient is not sure why she is here.  Denies chest pain, shortness of breath, or abdominal pain.  ED Course: Febrile upon arrival to the ED with axillary temperature 101 F.  This was reported to examining provider but not recorded in the chart.  Not tachycardic or hypotensive.  Labs showing mild leukocytosis (WBC count 12.5).  Lactic acid normal x2.  Urinalysis with positive nitrite, small amount of leukocytes, 21-50 WBCs, and many bacteria.  Urine culture pending.  SARS-CoV-2 PCR test negative.  EKG with questionable borderline T wave abnormalities in lateral leads.  High-sensitivity troponin mildly elevated 45 >69.   Chest x-ray not suggestive of pneumonia.  Patient was given ceftriaxone, Tylenol, and a 500 cc fluid bolus.  Review of Systems:  All systems reviewed and apart from history of presenting illness, are negative.  Past Medical History:  Diagnosis Date  . Alzheimer disease ([place])   . Arthritis   . Hx of colonic polyps 12/27/2014  . Hyperlipidemia   . 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 Vitamin D deficiency     Past Surgical History:  Procedure Laterality Date  . BREAST BIOPSY Left 2016   benign  . COLONOSCOPY  10-04-2009  . VAGINAL DELIVERY     x2     reports that  she has quit smoking. She has never used smokeless tobacco. She reports previous alcohol use. She reports that she does not use drugs.  Allergies  Allergen Reactions  . Clindamycin Other (See Comments)    Unknown reaction    Family History  Problem Relation Age of Onset  . Colon cancer Mother   . Heart disease Mother   . Alzheimer's disease Father   . Heart disease Father   . Leukemia Brother   . Breast cancer Maternal Grandmother   . Breast cancer Paternal Grandmother   . Paget's disease of bone Cousin   . Paget's disease of bone Cousin   . Polycythemia Brother   . Graves' disease Daughter   . Graves' disease Son   . Rectal cancer Neg Hx   . Stomach cancer Neg Hx     Prior to Admission medications   Medication Sig Start Date End Date Taking? Authorizing Provider  citalopram (CELEXA) 10 MG tablet Take 1 tablet (10 mg total) by mouth daily. 02/02/20  Yes Ward Givens, NP  donepezil (ARICEPT) 10 MG tablet TAKE 1 TABLET BY MOUTH AT BEDTIME Patient taking differently: Take 10 mg by mouth at bedtime.  02/22/20  Yes Ward Givens, NP  levothyroxine (SYNTHROID, LEVOTHROID) 75 MCG tablet Take 75 mcg by mouth daily before breakfast.  12/07/13  Yes [provider]  Melatonin 5 MG TABS Take 10 mg by mouth at bedtime.    Yes [provider]  memantine (NAMENDA) 10 MG tablet  Take 1 tablet (10 mg total) by mouth 2 (two) times daily. 06/29/19  Yes Ward Givens, NP  Multiple Vitamins-Minerals (OCUVITE ADULT 50+) CAPS Take 1 capsule by mouth daily with lunch.   Yes [provider]  OVER THE COUNTER MEDICATION Take by mouth See admin instructions. [employer] Recode Alzheimer's regimen of probotics, vitamins, omega 3, MCT oil, etc - take 8 capsules by mouth with each meal, Mix 2 scoops of morning balance powder in almond milk and drink every morning, mix 1 scoop of evening balance in water and drink every evening.   Yes [provider]    Physical  Exam: Vitals:   03/05/20 1935 03/05/20 2230 03/05/20 2258 03/05/20 2300  BP:   129/69 122/77  Pulse:      Resp:   13 17  Temp: 99.5 F (37.5 C) 98.8 F (37.1 C)    TempSrc: Oral Oral    SpO2:        Physical Exam Constitutional:      General: She is not in acute distress.    Appearance: She is not toxic-appearing.  HENT:     Head: Normocephalic.     Mouth/Throat:     Mouth: Mucous membranes are moist.  Eyes:     Extraocular Movements: Extraocular movements intact.  Cardiovascular:     Rate and Rhythm: Normal rate and regular rhythm.     Pulses: Normal pulses.  Pulmonary:     Effort: Pulmonary effort is normal. No respiratory distress.     Breath sounds: Normal breath sounds. No wheezing or rales.  Abdominal:     General: Bowel sounds are normal. There is no distension.     Palpations: Abdomen is soft.     Tenderness: There is no abdominal tenderness. There is no guarding.  Musculoskeletal:        General: No swelling.     Cervical back: Normal range of motion and neck supple.  Skin:    General: Skin is warm and dry.  Neurological:     Mental Status: She is alert.     Comments: Oriented to self only     Labs on Admission: I have personally reviewed following labs and imaging studies  CBC: Recent Labs  Lab 03/05/20 1654  WBC 12.5*  NEUTROABS 11.2*  HGB 13.4  HCT 40.2  MCV 91.6  PLT 546   Basic Metabolic Panel: Recent Labs  Lab 03/05/20 1654  NA 134*  K 4.1  CL 101  CO2 24  GLUCOSE 126*  BUN 17  CREATININE 0.87  CALCIUM 9.1   GFR: CrCl cannot be calculated (Unknown ideal weight.). Liver Function Tests: Recent Labs  Lab 03/05/20 1654  AST 24  ALT 17  ALKPHOS 56  BILITOT 0.6  PROT 6.6  ALBUMIN 3.5   No results for input(s): LIPASE, AMYLASE in the last 168 hours. No results for input(s): AMMONIA in the last 168 hours. Coagulation Profile: Recent Labs  Lab 03/05/20 1654  INR 1.1   Cardiac Enzymes: No results for input(s): CKTOTAL,  CKMB, CKMBINDEX, TROPONINI in the last 168 hours. BNP (last 3 results) No results for input(s): PROBNP in the last 8760 hours. HbA1C: No results for input(s): HGBA1C in the last 72 hours. CBG: No results for input(s): GLUCAP in the last 168 hours. Lipid Profile: No results for input(s): CHOL, HDL, LDLCALC, TRIG, CHOLHDL, LDLDIRECT in the last 72 hours. Thyroid Function Tests: No results for input(s): TSH, T4TOTAL, FREET4, T3FREE, THYROIDAB in the last 72 hours. Anemia Panel:  No results for input(s): VITAMINB12, FOLATE, FERRITIN, TIBC, IRON, RETICCTPCT in the last 72 hours. Urine analysis:    Component Value Date/Time   COLORURINE YELLOW 03/05/2020 2030   APPEARANCEUR HAZY (A) 03/05/2020 2030   LABSPEC 1.013 03/05/2020 2030   [place] 6.0 03/05/2020 2030   GLUCOSEU NEGATIVE 03/05/2020 2030   [place] NEGATIVE 03/05/2020 2030   [place] NEGATIVE 03/05/2020 2030   [place] 03/05/2020 2030   PROTEINUR 30 (A) 03/05/2020 2030   NITRITE POSITIVE (A) 03/05/2020 2030   LEUKOCYTESUR SMALL (A) 03/05/2020 2030    Radiological Exams on Admission: DG Chest Port 1 View  Result Date: 03/05/2020 CLINICAL DATA:  74 year old female with fever. EXAM: PORTABLE CHEST 1 VIEW COMPARISON:  None. FINDINGS: Minimal left lung base atelectasis. No focal consolidation, pleural effusion, pneumothorax. There is mild cardiomegaly. Atherosclerotic calcification of the aorta. No acute osseous pathology. IMPRESSION: 1. No acute cardiopulmonary process. 2. Mild cardiomegaly. Electronically Signed   By: Anner Crete M.D.   On: 03/05/2020 16:38    EKG: Independently reviewed.  Sinus rhythm, questionable borderline T wave abnormalities in lateral leads.  No prior EKG for comparison.  Assessment/Plan Principal Problem:   UTI (urinary tract infection) Active Problems:   Dementia of the Alzheimer's type, with late onset, with depressed mood (HCC)   Sepsis ([place])   Elevated troponin   Hypothyroidism    Sepsis secondary to UTI: Febrile and labs showing mild leukocytosis (WBC count 12.5).  Not tachycardic or hypotensive.  Lactic acid x2 normal. Urinalysis with positive nitrite, small amount of leukocytes, 21-50 WBCs, and many bacteria.  -Ceftriaxone, IV fluid hydration, and Tylenol as needed for fevers.  Urine and blood cultures pending.  Continue to monitor WBC count.  Shaking: Husband reported patient shaking.  Likely due to rigors given fever and UTI.  No prior documented history of seizures. -Monitor closely and order EEG if there is any concern for seizure-like activity  Troponinemia: High-sensitivity troponin mildly elevated 45 >69. EKG with questionable borderline T wave abnormalities in lateral leads (no prior tracing for comparison).  Patient denies chest pain at present and appears comfortable. -Cardiac monitoring, trend troponin, and obtain echocardiogram.  Alzheimer's dementia -Continue Aricept and Namenda  Depression -Continue Celexa  Hypothyroidism -Continue Synthroid  Physical deconditioning -PT eval  DVT prophylaxis: Lovenox Code Status: Full code Family Communication: No family available at this time. Disposition Plan: Status is: Inpatient  Remains inpatient appropriate because:IV treatments appropriate due to intensity of illness or inability to take PO and Inpatient level of care appropriate due to severity of illness   Dispo: The patient is from: Home              Anticipated d/c is to: SNF              Anticipated d/c date is: 3 days              Patient currently is not medically stable to d/c.  The medical decision making on this patient was of high complexity and the patient is at high risk for clinical deterioration, therefore this is a level 3 visit.  Shela Leff MD [facility]  If 7PM-7AM, please contact night-coverage [email or web address]  03/05/2020, 11:25 PM

## 2020-03-05 NOTE — ED Triage Notes (Signed)
Pt arrived to ED via [facility] d/t her husband stating that she had 'tremulous" activity that "is not normal for her" as well as he not eating all of her last meal, and that he noticed she had increased weakness. Pt has Alzheimer's at baseline & husband stated that she can not tell you where she is, the year, or her location, but she does answer to her name.

## 2020-03-05 NOTE — ED Provider Notes (Signed)
[facility] EMERGENCY DEPARTMENT Provider Note   CSN: [id number] Arrival date & time: 03/05/20  1534     History Chief Complaint  Patient presents with  . Tremors  . Weakness    Meagan Schwartz is a 74 y.o. female.  74 year old female with prior medical history as detailed below presents for evaluation of shaking and possible fever.  Patient's history is provided by her husband.  Patient has a history of Alzheimer's.  Level 5 caveat secondary to same.  Patient was at home in her normal state of health.  Patient's husband reports that she had been a little bit more lethargic than normal this morning.  This afternoon he witnessed an episode of shaking all over.  This lasted approximately 30 minutes.  On arrival to the ED approximately 45 minutes after the episode of shaking, the patient was noted to be febrile with an axillary temp of 101.  This was reported to the examining provider, however it was not recorded in the patient record.  Patient is otherwise at her baseline mental status.  Patient is without recent admission or significant acute medical problems.  The history is provided by the patient and medical records.  Weakness Severity:  Moderate Onset quality:  Sudden Duration:  5 hours Timing:  Constant Progression:  Unchanged Chronicity:  New Relieved by:  Nothing Worsened by:  Nothing      Past Medical History:  Diagnosis Date  . Alzheimer disease ([place])   . Arthritis   . Hx of colonic polyps 12/27/2014  . Hyperlipidemia   . 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 Vitamin D deficiency     Patient Active Problem List   Diagnosis Date Noted  . Dementia of the Alzheimer's type, with late onset, with depressed mood ([place]) 10/14/2017  . Cognitive deficits 02/20/2017  . Postural dizziness with presyncope 02/20/2017  . Hx of colonic polyps 10/04/2009    Past Surgical History:  Procedure Laterality Date  . BREAST BIOPSY Left 2016   benign    . COLONOSCOPY  10-04-2009  . VAGINAL DELIVERY     x2     OB History    Gravida  2   Para  2   Term  2   Preterm  0   AB  0   Living  2     SAB  0   TAB  0   Ectopic  0   Multiple  0   Live Births  2           Family History  Problem Relation Age of Onset  . Colon cancer Mother   . Heart disease Mother   . Alzheimer's disease Father   . Heart disease Father   . Leukemia Brother   . Breast cancer Maternal Grandmother   . Breast cancer Paternal Grandmother   . Paget's disease of bone Cousin   . Paget's disease of bone Cousin   . Polycythemia Brother   . Graves' disease Daughter   . Graves' disease Son   . Rectal cancer Neg Hx   . Stomach cancer Neg Hx     Social History   Tobacco Use  . Smoking status: Former Research scientist (life sciences)  . Smokeless tobacco: Never Used  . Tobacco comment: smoked while in college  Vaping Use  . Vaping Use: Never used  Substance Use Topics  . Alcohol use: Not Currently  . Drug use: No    Home Medications Prior to Admission  medications   Medication Sig Start Date End Date Taking? Authorizing Provider  citalopram (CELEXA) 10 MG tablet Take 1 tablet (10 mg total) by mouth daily. 02/02/20   Ward Givens, NP  donepezil (ARICEPT) 10 MG tablet TAKE 1 TABLET BY MOUTH AT BEDTIME 02/22/20   Ward Givens, NP  levothyroxine (SYNTHROID, LEVOTHROID) 75 MCG tablet Take 1 tablet by mouth daily. 12/07/13   [provider]  Melatonin 5 MG TABS Take 1 tablet by mouth once.    [provider]  memantine (NAMENDA) 10 MG tablet Take 1 tablet (10 mg total) by mouth 2 (two) times daily. 06/29/19   Ward Givens, NP  multivitamin-lutein (OCUVITE-LUTEIN) CAPS capsule Take 1 capsule by mouth daily.    [provider]  UNABLE TO FIND 1 tablet daily at 2 PM. Med Name:"Re-Code"    [provider]    Allergies    Clindamycin  Review of Systems   Review of Systems  Unable to perform ROS: Dementia  Neurological:  Positive for weakness.    Physical Exam Updated Vital Signs BP 118/67   Pulse 90   Temp 99.5 F (37.5 C) (Oral)   Resp 19   LMP 09/17/1996 (Approximate)   SpO2 95%   Physical Exam Vitals and nursing note reviewed.  Constitutional:      General: She is not in acute distress.    Appearance: Normal appearance. She is well-developed.  HENT:     Head: Normocephalic and atraumatic.  Eyes:     Conjunctiva/sclera: Conjunctivae normal.     Pupils: Pupils are equal, round, and reactive to light.  Cardiovascular:     Rate and Rhythm: Normal rate and regular rhythm.     Heart sounds: Normal heart sounds.  Pulmonary:     Effort: Pulmonary effort is normal. No respiratory distress.     Breath sounds: Normal breath sounds.  Abdominal:     General: There is no distension.     Palpations: Abdomen is soft.     Tenderness: There is no abdominal tenderness.  Musculoskeletal:        General: No deformity. Normal range of motion.     Cervical back: Normal range of motion and neck supple.  Skin:    General: Skin is warm and dry.  Neurological:     General: No focal deficit present.     Mental Status: She is alert. Mental status is at baseline.     ED Results / Procedures / Treatments   Labs (all labs ordered are listed, but only abnormal results are displayed) Labs Reviewed  CBC WITH DIFFERENTIAL/PLATELET - Abnormal; Notable for the following components:      Result Value   WBC 12.5 (*)    Neutro Abs 11.2 (*)    Lymphs Abs 0.3 (*)    All other components within normal limits  COMPREHENSIVE METABOLIC PANEL - Abnormal; Notable for the following components:   Sodium 134 (*)    Glucose, Bld 126 (*)    All other components within normal limits  URINALYSIS, ROUTINE W REFLEX MICROSCOPIC - Abnormal; Notable for the following components:   APPearance HAZY (*)    Protein, ur 30 (*)    Nitrite POSITIVE (*)    Leukocytes,Ua SMALL (*)    Bacteria, UA MANY (*)    All other components  within normal limits  TROPONIN I (HIGH SENSITIVITY) - Abnormal; Notable for the following components:   Troponin I (High Sensitivity) 45 (*)    All other components within  normal limits  TROPONIN I (HIGH SENSITIVITY) - Abnormal; Notable for the following components:   Troponin I (High Sensitivity) 69 (*)    All other components within normal limits  SARS CORONAVIRUS 2 BY RT PCR (HOSPITAL ORDER, [facility])  CULTURE, BLOOD (ROUTINE X 2)  CULTURE, BLOOD (ROUTINE X 2)  URINE CULTURE  LACTIC ACID, PLASMA  PROTIME-INR  LACTIC ACID, PLASMA    EKG EKG Interpretation  Date/Time:  Saturday March 05 2020 15:52:28 EDT Ventricular Rate:  88 PR Interval:    QRS Duration: 87 QT Interval:  348 QTC Calculation: 421 R Axis:   -23 Text Interpretation: Sinus rhythm Probable left atrial enlargement Borderline left axis deviation Borderline T abnormalities, lateral leads Confirmed by Dene Gentry [phone]) on 03/05/2020 3:53:50 PM   Radiology DG Chest Port 1 View  Result Date: 03/05/2020 CLINICAL DATA:  74 year old female with fever. EXAM: PORTABLE CHEST 1 VIEW COMPARISON:  None. FINDINGS: Minimal left lung base atelectasis. No focal consolidation, pleural effusion, pneumothorax. There is mild cardiomegaly. Atherosclerotic calcification of the aorta. No acute osseous pathology. IMPRESSION: 1. No acute cardiopulmonary process. 2. Mild cardiomegaly. Electronically Signed   By: Anner Crete M.D.   On: 03/05/2020 16:38    Procedures Procedures (including critical care time)  Medications Ordered in ED Medications  cefTRIAXone (ROCEPHIN) 1 g in sodium chloride 0.9 % 100 mL IVPB (has no administration in time range)  acetaminophen (TYLENOL) tablet 650 mg (650 mg Oral Given 03/05/20 1804)  sodium chloride 0.9 % bolus 500 mL (0 mLs Intravenous Stopped 03/05/20 1754)    ED Course  I have reviewed the triage vital signs and the nursing notes.  Pertinent labs & imaging  results that were available during my care of the patient were reviewed by me and considered in my medical decision making (see chart for details).    MDM Rules/Calculators/A&P                          MDM  Screen complete  Meagan Schwartz was evaluated in Emergency Department on 03/05/2020 for the symptoms described in the history of present illness. She was evaluated in the context of the global COVID-19 pandemic, which necessitated consideration that the patient might be at risk for infection with the SARS-CoV-2 virus that causes COVID-19. Institutional protocols and algorithms that pertain to the evaluation of patients at risk for COVID-19 are in a state of rapid change based on information released by regulatory bodies including the CDC and federal and state organizations. These policies and algorithms were followed during the patient's care in the ED.  Patient is presenting for evaluation of fever with shaking.  Patient's presentation is consistent with likely rigors.  Work-up reveals presence of likely UTI with possible systemic response with fever.  Patient's initial temperature of 101 was not recorded in the ED record.  Patient will be admitted for further work-up and treatment.  Medicine service is aware of case and will evaluate for admission.   Final Clinical Impression(s) / ED Diagnoses Final diagnoses:  Fever, unspecified fever cause  Urinary tract infection without hematuria, site unspecified    Rx / DC Orders ED Discharge Orders    None       Valarie Merino, MD 03/05/20 2139

## 2020-03-06 ENCOUNTER — Inpatient Hospital Stay (HOSPITAL_COMMUNITY): Payer: Medicare Other

## 2020-03-06 DIAGNOSIS — I351 Nonrheumatic aortic (valve) insufficiency: Secondary | ICD-10-CM

## 2020-03-06 LAB — BLOOD CULTURE ID PANEL (REFLEXED)

## 2020-03-06 LAB — ECHOCARDIOGRAM COMPLETE: Weight: 1883.61 oz

## 2020-03-06 LAB — TROPONIN I (HIGH SENSITIVITY)
Troponin I (High Sensitivity): 69 ng/L — ABNORMAL HIGH (ref <18)
Troponin I (High Sensitivity): 57 ng/L — ABNORMAL HIGH (ref <18)

## 2020-03-06 MED ORDER — SODIUM CHLORIDE 0.9 % IV SOLN
2.0000 g | INTRAVENOUS | Status: DC
  Administered 2020-03-06 – 2020-03-08 (×3): 2 g via INTRAVENOUS
  Filled 2020-03-06 (×4): qty 20

## 2020-03-06 NOTE — Progress Notes (Signed)
  Echocardiogram 2D Echocardiogram has been performed.  Meagan Schwartz 03/06/2020, 3:57 PM

## 2020-03-06 NOTE — Progress Notes (Signed)
Patient ID: ENGLISH TOMER, female   DOB: 26-Oct-1945, 74 y.o.   MRN: [id number]  PROGRESS NOTE    Leighton Luster Tosh  IWP:[id number] DOB: 1945-10-07 DOA: 03/05/2020 PCP: Hulan Fess, MD    Brief Narrative:  Meagan Schwartz is a 74 y.o. female with medical history significant of advanced Alzheimer's dementia (oriented to self only at baseline), hyperlipidemia, hypothyroidism presenting to the ED via EMS for evaluation of shaking and possible fever.  No family available at this time. Husband reported to ED provider that patient has advanced dementia and is oriented to self only at baseline.  She was a bit more lethargic than normal this morning.  This morning he witnessed an episode of shaking all over which lasted approximately 30 minutes.   Febrile upon arrival to the ED with axillary temperature 101 F. Not tachycardic or hypotensive.  Labs showing mild leukocytosis (WBC count 12.5).  Lactic acid normal x2.  Urinalysis with positive nitrite, small amount of leukocytes, 21-50 WBCs, and many bacteria.  Urine culture pending. Blood cultures + x 2 for E. Coli.  SARS-CoV-2 PCR test negative.  EKG with questionable borderline T wave abnormalities in lateral leads.  High-sensitivity troponin mildly elevated 45 >69>57-->stable. Treated presumptively for Urosepsis Patient was given ceftriaxone, Tylenol, and a 500 cc fluid bolus.   Assessment & Plan:   Principal Problem:   UTI (urinary tract infection) Active Problems:   Dementia of the Alzheimer's type, with late onset, with depressed mood (HCC)   Sepsis ([place])   Elevated troponin   Hypothyroidism  Sepsis secondary to UTI: Febrile and blood cultures growing E. Coli. -Ceftriaxone x 7 days, may do home health IV at home once sensitivities are known, IV fluid hydration, and Tylenol as needed for fevers.    Troponinemia: High-sensitivity troponin mildly elevated 45 >69. EKG with questionable borderline T wave abnormalities in lateral leads (no prior  tracing for comparison).  Patient denies chest pain at present and appears comfortable. -Cardiac monitoring, trend troponin, and obtain echocardiogram-->ECHO normal.  Alzheimer's dementia -Continue Aricept and Namenda  Depression -Continue Celexa  Hypothyroidism -Continue Synthroid  Physical deconditioning -PT eval   DVT prophylaxis: Lovenox SQ Code Status: Full code  Family Communication: Daughter at bedside, has Living will-->will bring to hospital Disposition Plan: ? Pending PT eval   Consultants:   None  Procedures:  Echo - reveals EF 60-65%, normal valves, grade 1 DD  Antimicrobials: Anti-infectives (From admission, onward)   Start     Dose/Rate Route Frequency Ordered Stop   03/06/20 2130  cefTRIAXone (ROCEPHIN) 1 g in sodium chloride 0.9 % 100 mL IVPB  Status:  Discontinued        1 g 200 mL/hr over 30 Minutes Intravenous Every 24 hours 03/05/20 2323 03/06/20 0604   03/06/20 0800  cefTRIAXone (ROCEPHIN) 2 g in sodium chloride 0.9 % 100 mL IVPB     Discontinue     2 g 200 mL/hr over 30 Minutes Intravenous Every 24 hours 03/06/20 0604     03/05/20 2130  cefTRIAXone (ROCEPHIN) 1 g in sodium chloride 0.9 % 100 mL IVPB        1 g 200 mL/hr over 30 Minutes Intravenous  Once 03/05/20 2119 03/05/20 2254       Subjective: Awake and more alert today. Pleasant   Objective: Vitals:   03/06/20 0615 03/06/20 0630 03/06/20 0645 03/06/20 0804  BP: 102/65 109/63 136/83 136/71  Pulse: 70 67 (!) 56 72  Resp: 13   16  Temp:    98.1 F (36.7 C)  TempSrc:    Oral  SpO2: 96% 96% 99% 98%  Weight:    53.4 kg    Intake/Output Summary (Last 24 hours) at 03/06/2020 1729 Last data filed at 03/06/2020 1406 Gross per 24 hour  Intake 682.81 ml  Output 150 ml  Net 532.81 ml   Filed Weights   03/06/20 0804  Weight: 53.4 kg    Examination:  General exam: Appears calm and comfortable  Respiratory system: Clear to auscultation. Respiratory effort  normal. Cardiovascular system: S1 & S2 heard, RRR.  Gastrointestinal system: Abdomen is nondistended, soft and nontender.  Central nervous system: Alert and oriented. No focal neurological deficits. Extremities: Symmetric  Skin: No rashes Neuro: Oriented to self only    Data Reviewed: I have personally reviewed following labs and imaging studies  CBC: Recent Labs  Lab 03/05/20 1654  WBC 12.5*  NEUTROABS 11.2*  HGB 13.4  HCT 40.2  MCV 91.6  PLT 659   Basic Metabolic Panel: Recent Labs  Lab 03/05/20 1654  NA 134*  K 4.1  CL 101  CO2 24  GLUCOSE 126*  BUN 17  CREATININE 0.87  CALCIUM 9.1   GFR: Estimated Creatinine Clearance: 48.5 mL/min (by C-G formula based on SCr of 0.87 mg/dL). Liver Function Tests: Recent Labs  Lab 03/05/20 1654  AST 24  ALT 17  ALKPHOS 56  BILITOT 0.6  PROT 6.6  ALBUMIN 3.5   Coagulation Profile: Recent Labs  Lab 03/05/20 1654  INR 1.1   Sepsis Labs: Recent Labs  Lab 03/05/20 1805 03/05/20 2135  LATICACIDVEN 0.7 0.7    Recent Results (from the past 240 hour(s))  Culture, blood (routine x 2)     Status: None (Preliminary result)   Collection Time: 03/05/20  5:48 PM   Specimen: BLOOD RIGHT HAND  Result Value Ref Range Status   Specimen Description BLOOD RIGHT HAND  Final   Special Requests   Final    BOTTLES DRAWN AEROBIC AND ANAEROBIC Blood Culture adequate volume   Culture  Setup Time   Final    GRAM NEGATIVE RODS IN BOTH AEROBIC AND ANAEROBIC BOTTLES CRITICAL RESULT CALLED TO, READ BACK BY AND VERIFIED WITH: Salli Real [id number] 03/06/2020 Mena Goes Performed at [facility], [address] [place], [place] [address]    Culture GRAM NEGATIVE RODS  Final   Report Status PENDING  Incomplete  Blood Culture ID Panel (Reflexed)     Status: Abnormal   Collection Time: 03/05/20  5:48 PM  Result Value Ref Range Status   Enterococcus species NOT DETECTED NOT DETECTED Final   Listeria monocytogenes NOT DETECTED NOT  DETECTED Final   Staphylococcus species NOT DETECTED NOT DETECTED Final   Staphylococcus aureus (BCID) NOT DETECTED NOT DETECTED Final   Streptococcus species NOT DETECTED NOT DETECTED Final   Streptococcus agalactiae NOT DETECTED NOT DETECTED Final   Streptococcus pneumoniae NOT DETECTED NOT DETECTED Final   Streptococcus pyogenes NOT DETECTED NOT DETECTED Final   Acinetobacter baumannii NOT DETECTED NOT DETECTED Final   Enterobacteriaceae species DETECTED (A) NOT DETECTED Final    Comment: Enterobacteriaceae represent a large family of gram-negative bacteria, not a single organism. CRITICAL RESULT CALLED TO, READ BACK BY AND VERIFIED WITH: G. ABBOTT,PHARMD [id number] 03/06/2020 T. TYSOR    Enterobacter cloacae complex NOT DETECTED NOT DETECTED Final   Escherichia coli DETECTED (A) NOT DETECTED Final    Comment: CRITICAL RESULT CALLED TO, READ BACK BY AND VERIFIED  WITH: Salli Real [phone] 03/06/2020 T. TYSOR    Klebsiella oxytoca NOT DETECTED NOT DETECTED Final   Klebsiella pneumoniae NOT DETECTED NOT DETECTED Final   Proteus species NOT DETECTED NOT DETECTED Final   Serratia marcescens NOT DETECTED NOT DETECTED Final   Carbapenem resistance NOT DETECTED NOT DETECTED Final   Haemophilus influenzae NOT DETECTED NOT DETECTED Final   Neisseria meningitidis NOT DETECTED NOT DETECTED Final   Pseudomonas aeruginosa NOT DETECTED NOT DETECTED Final   Candida albicans NOT DETECTED NOT DETECTED Final   Candida glabrata NOT DETECTED NOT DETECTED Final   Candida krusei NOT DETECTED NOT DETECTED Final   Candida parapsilosis NOT DETECTED NOT DETECTED Final   Candida tropicalis NOT DETECTED NOT DETECTED Final    Comment: Performed at [facility], [place] [address], [place], [place] [address]  SARS Coronavirus 2 by RT PCR (hospital order, performed in [facility] hospital lab) Nasopharyngeal Nasopharyngeal Swab     Status: None   Collection Time: 03/05/20  5:55 PM   Specimen: Nasopharyngeal Swab   Result Value Ref Range Status   SARS Coronavirus 2 NEGATIVE NEGATIVE Final    Comment: (NOTE) SARS-CoV-2 target nucleic acids are NOT DETECTED.  The SARS-CoV-2 RNA is generally detectable in upper and lower respiratory specimens during the acute phase of infection. The lowest concentration of SARS-CoV-2 viral copies this assay can detect is 250 copies / mL. A negative result does not preclude SARS-CoV-2 infection and should not be used as the sole basis for treatment or other patient management decisions.  A negative result may occur with improper specimen collection / handling, submission of specimen other than nasopharyngeal swab, presence of viral mutation(s) within the areas targeted by this assay, and inadequate number of viral copies (<250 copies / mL). A negative result must be combined with clinical observations, patient history, and epidemiological information.  Fact Sheet for Patients:   StrictlyIdeas.no  Fact Sheet for Healthcare Providers: BankingDealers.co.za  This test is not yet approved or  cleared by the [place] FDA and has been authorized for detection and/or diagnosis of SARS-CoV-2 by FDA under an Emergency Use Authorization (EUA).  This EUA will remain in effect (meaning this test can be used) for the duration of the COVID-19 declaration under Section 564(b)(1) of the Act, 21 U.S.C. section 360bbb-3(b)(1), unless the authorization is terminated or revoked sooner.  Performed at [facility], [place] [address], [place], [place] [address]   Culture, blood (routine x 2)     Status: None (Preliminary result)   Collection Time: 03/05/20  6:00 PM   Specimen: BLOOD LEFT HAND  Result Value Ref Range Status   Specimen Description BLOOD LEFT HAND  Final   Special Requests   Final    BOTTLES DRAWN AEROBIC AND ANAEROBIC Blood Culture adequate volume   Culture  Setup Time   Final    GRAM NEGATIVE RODS ANAEROBIC  BOTTLE ONLY CRITICAL VALUE NOTED.  VALUE IS CONSISTENT WITH PREVIOUSLY REPORTED AND CALLED VALUE. Performed at [facility], [place] [address] [place], [place] [address]    Culture GRAM NEGATIVE RODS  Final   Report Status PENDING  Incomplete      Radiology Studies: DG Chest Port 1 View  Result Date: 03/05/2020 CLINICAL DATA:  74 year old female with fever. EXAM: PORTABLE CHEST 1 VIEW COMPARISON:  None. FINDINGS: Minimal left lung base atelectasis. No focal consolidation, pleural effusion, pneumothorax. There is mild cardiomegaly. Atherosclerotic calcification of the aorta. No acute osseous pathology. IMPRESSION:  1. No acute cardiopulmonary process. 2. Mild cardiomegaly. Electronically Signed   By: Anner Crete M.D.   On: 03/05/2020 16:38   ECHOCARDIOGRAM COMPLETE  Result Date: 03/06/2020    ECHOCARDIOGRAM REPORT   Patient Name:   TAMSEN REIST Prude Date of Exam: 03/06/2020 Medical Rec #:  [id number]      Height:       64.0 in Accession #:    [id number]     Weight:       117.7 lb Date of Birth:  05/10/1946      BSA:          1.562 m Patient Age:    27 years       BP:           136/71 mmHg Patient Gender: F              HR:           65 bpm. Exam Location:  Inpatient Procedure: 2D Echo Indications:    elevated troponin  History:        Patient has no prior history of Echocardiogram examinations.                 Risk Factors:Dyslipidemia and Former Smoker.  Sonographer:    Jannett Celestine RDCS (AE) Referring Phys: [id number] Shela Leff  Sonographer Comments: see comments IMPRESSIONS  1. Left ventricular ejection fraction, by estimation, is 60 to 65%. The left ventricle has normal function. The left ventricle has no regional wall motion abnormalities. Left ventricular diastolic parameters are consistent with Grade I diastolic dysfunction (impaired relaxation).  2. Right ventricular systolic function is normal. The right ventricular size is normal. There is normal pulmonary artery systolic pressure.   3. The mitral valve is normal in structure. Trivial mitral valve regurgitation. No evidence of mitral stenosis.  4. The aortic valve is normal in structure. Aortic valve regurgitation is mild. Mild to moderate aortic valve sclerosis/calcification is present, without any evidence of aortic stenosis.  5. The inferior vena cava is normal in size with greater than 50% respiratory variability, suggesting right atrial pressure of 3 mmHg. FINDINGS  Left Ventricle: Left ventricular ejection fraction, by estimation, is 60 to 65%. The left ventricle has normal function. The left ventricle has no regional wall motion abnormalities. The left ventricular internal cavity size was normal in size. There is  no left ventricular hypertrophy. Left ventricular diastolic parameters are consistent with Grade I diastolic dysfunction (impaired relaxation). Normal left ventricular filling pressure. Right Ventricle: The right ventricular size is normal. No increase in right ventricular wall thickness. Right ventricular systolic function is normal. There is normal pulmonary artery systolic pressure. Left Atrium: Left atrial size was normal in size. Right Atrium: Right atrial size was normal in size. Pericardium: There is no evidence of pericardial effusion. Mitral Valve: The mitral valve is normal in structure. Normal mobility of the mitral valve leaflets. Moderate mitral annular calcification. Trivial mitral valve regurgitation. No evidence of mitral valve stenosis. Tricuspid Valve: The tricuspid valve is normal in structure. Tricuspid valve regurgitation is not demonstrated. No evidence of tricuspid stenosis. Aortic Valve: The aortic valve is normal in structure.. There is mild thickening and mild calcification of the aortic valve. Aortic valve regurgitation is mild. Mild to moderate aortic valve sclerosis/calcification is present, without any evidence of aortic stenosis. There is mild thickening of the aortic valve. There is mild  calcification of the aortic valve. Pulmonic Valve: The pulmonic valve was normal in structure. Pulmonic valve  regurgitation is not visualized. No evidence of pulmonic stenosis. Aorta: The aortic root is normal in size and structure. Venous: The inferior vena cava is normal in size with greater than 50% respiratory variability, suggesting right atrial pressure of 3 mmHg. IAS/Shunts: No atrial level shunt detected by color flow Doppler.  LEFT VENTRICLE PLAX 2D LVIDd:         4.60 cm LVIDs:         2.90 cm LV PW:         0.70 cm LV IVS:        0.80 cm LVOT diam:     2.10 cm LV SV:         48 LV SV Index:   31 LVOT Area:     3.46 cm  LEFT ATRIUM             Index LA diam:        3.00 cm 1.92 cm/m LA Vol (A2C):   28.6 ml 18.31 ml/m LA Vol (A4C):   33.7 ml 21.58 ml/m LA Biplane Vol: 34.3 ml 21.96 ml/m  AORTIC VALVE LVOT Vmax:   77.50 cm/s LVOT Vmean:  53.500 cm/s LVOT VTI:    0.138 m  AORTA Ao Root diam: 3.00 cm MITRAL VALVE MV Area (PHT): 3.06 cm    SHUNTS MV Decel Time: 248 msec    Systemic VTI:  0.14 m MV E velocity: 63.00 cm/s  Systemic Diam: 2.10 cm MV A velocity: 72.00 cm/s MV E/A ratio:  0.88 Ena Dawley MD Electronically signed by Ena Dawley MD Signature Date/Time: 03/06/2020/4:02:19 PM    Final      Scheduled Meds: . citalopram  10 mg Oral Daily  . donepezil  10 mg Oral QHS  . enoxaparin (LOVENOX) injection  40 mg Subcutaneous Q24H  . levothyroxine  75 mcg Oral QAC breakfast  . memantine  10 mg Oral BID  . multivitamin  1 tablet Oral Q lunch   Continuous Infusions: . cefTRIAXone (ROCEPHIN)  IV 2 g (03/06/20 1008)     LOS: 1 day    Donnamae Jude, MD 03/06/2020 5:29 PM [phone] [facility] If 7PM-7AM, please contact night-coverage 03/06/2020, 5:29 PM

## 2020-03-06 NOTE — ED Notes (Signed)
Heart healthy breakfast tray

## 2020-03-06 NOTE — Progress Notes (Signed)
PHARMACY - PHYSICIAN COMMUNICATION CRITICAL VALUE ALERT - BLOOD CULTURE IDENTIFICATION (BCID)  Meagan Schwartz is an 74 y.o. female who presented to [facility] on 03/05/2020 with a chief complaint of fevers/urosepsis  Assessment:  2/2 blood cultures growing E. Coli  Name of physician (or Provider) Contacted:  Dr. Kennon Rounds  Current antibiotics:  Rocephin  Changes to prescribed antibiotics recommended:  No changes recommended  Results for orders placed or performed during the hospital encounter of 03/05/20  Blood Culture ID Panel (Reflexed) (Collected: 03/05/2020  5:48 PM)  Result Value Ref Range   Enterococcus species NOT DETECTED NOT DETECTED   Listeria monocytogenes NOT DETECTED NOT DETECTED   Staphylococcus species NOT DETECTED NOT DETECTED   Staphylococcus aureus (BCID) NOT DETECTED NOT DETECTED   Streptococcus species NOT DETECTED NOT DETECTED   Streptococcus agalactiae NOT DETECTED NOT DETECTED   Streptococcus pneumoniae NOT DETECTED NOT DETECTED   Streptococcus pyogenes NOT DETECTED NOT DETECTED   Acinetobacter baumannii NOT DETECTED NOT DETECTED   Enterobacteriaceae species DETECTED (A) NOT DETECTED   Enterobacter cloacae complex NOT DETECTED NOT DETECTED   Escherichia coli DETECTED (A) NOT DETECTED   Klebsiella oxytoca NOT DETECTED NOT DETECTED   Klebsiella pneumoniae NOT DETECTED NOT DETECTED   Proteus species NOT DETECTED NOT DETECTED   Serratia marcescens NOT DETECTED NOT DETECTED   Carbapenem resistance NOT DETECTED NOT DETECTED   Haemophilus influenzae NOT DETECTED NOT DETECTED   Neisseria meningitidis NOT DETECTED NOT DETECTED   Pseudomonas aeruginosa NOT DETECTED NOT DETECTED   Candida albicans NOT DETECTED NOT DETECTED   Candida glabrata NOT DETECTED NOT DETECTED   Candida krusei NOT DETECTED NOT DETECTED   Candida parapsilosis NOT DETECTED NOT DETECTED   Candida tropicalis NOT DETECTED NOT DETECTED    [name] 03/06/2020  7:22 AM

## 2020-03-07 DIAGNOSIS — G9340 Encephalopathy, unspecified: Secondary | ICD-10-CM

## 2020-03-07 DIAGNOSIS — R6521 Severe sepsis with septic shock: Secondary | ICD-10-CM

## 2020-03-07 DIAGNOSIS — A4151 Sepsis due to Escherichia coli [E. coli]: Secondary | ICD-10-CM

## 2020-03-07 NOTE — Progress Notes (Signed)
Hydrologist [facility])  Hospital Liaison: RN note         Notified by [facility] manager of patient/family request for [facility] Palliative services at home after discharge.         [place] Palliative team will follow up with patient after discharge.         Please call with any hospice or palliative related questions.         Thank you for this referral.         Domenic Moras, BSN, RN [place] (listed on [place] under Hospice[facility])    [phone]

## 2020-03-07 NOTE — Evaluation (Signed)
Physical Therapy Evaluation Patient Details Name: Meagan Schwartz MRN: [id number] DOB: Nov 11, 1945 Today's Date: 03/07/2020   History of Present Illness  Pt is a 74 yo female presenting with increased lethargy and tremors, diagnosed with UTI upon admission. The pt has Alzheimer's dementia at baseline (oriented to self only), as well as HLD, and hypothyroidism.  Clinical Impression  Pt in bed upon arrival of PT, agreeable to evaluation at this time. Prior to admission the pt was able to ambulate with use of a gait belt and assist by family members, but was dependent with all ADLs due to AD. The pt now presents with limitations in functional mobility, strength, and balance resulting in significantly increased assist needed to achieve stand and complete short ambulation in her room. Despite the increased level of assistance needed, I believe the best course of action and continued therapy for this pt would be in a familiar environment with a familiar schedule, and therefore feel that home with family assist and a HHPT consult will be the best course of action for this patient. We will continue to follow and work to progress functional strength and stability to reduce assistance needed for functional transfers to reduce caregiver burden.       Follow Up Recommendations Home health PT;Supervision/Assistance - 24 hour (resume nighttime care assistants)    Equipment Recommendations   (pt has needed equipment)    Recommendations for Other Services       Precautions / Restrictions Precautions Precautions: Fall Precaution Comments: Alzheimer's dementia at baseline Restrictions Weight Bearing Restrictions: No      Mobility  Bed Mobility Overal bed mobility: Needs Assistance Bed Mobility: Rolling;Supine to Sit;Sit to Supine Rolling: Mod assist   Supine to sit: Mod assist Sit to supine: Mod assist   General bed mobility comments: modA to complete bed mobiliy, pt slightly resistive without  repeated simple explanations. Pt reporting pain in hips/groin (possibly from [place]) with transition to sitting. modA with trunk and BLE to reposition in bed  Transfers Overall transfer level: Needs assistance Equipment used: 2 person hand held assist;Rolling walker (2 wheeled) Transfers: Sit to/from Stand Sit to Stand: Mod assist;+2 physical assistance;Max assist;From elevated surface         General transfer comment: modA initially to power up, but pt with strong posterior lean, unable to extend hips requiring maxA to maintain upright position. Pt needs hand-over-hand assist to  Ambulation/Gait Ambulation/Gait assistance: Max assist;+2 physical assistance Gait Distance (Feet): 3 Feet Assistive device: 2 person hand held assist Gait Pattern/deviations: Step-to pattern;Leaning posteriorly;Trunk flexed Gait velocity: decreased Gait velocity interpretation: <1.31 ft/sec, indicative of household ambulator General Gait Details: pt with small steps but requiring maxA to maintain upright due to posterior lean    Balance Overall balance assessment: Needs assistance Sitting-balance support: Bilateral upper extremity supported;Feet supported Sitting balance-Leahy Scale: Poor Sitting balance - Comments: strong posterior lean, pt unaware of lean Postural control: Posterior lean Standing balance support: Bilateral upper extremity supported;During functional activity Standing balance-Leahy Scale: Zero Standing balance comment: maxA +2 to maintain upright                             Pertinent Vitals/Pain Pain Assessment: No/denies pain (very sensitive to purewick, no complaints once in place)    Home Living Family/patient expects to be discharged to:: Private residence   Available Help at Discharge: Family;Available 24 hours/day (husband 24/7, daughter as possible. nighttime assistant to assist at night.) Type of  Home: House Home Access: Stairs to enter Entrance  Stairs-Rails: Left Entrance Stairs-Number of Steps: 2 Home Layout: Two level;Able to live on main level with bedroom/bathroom Home Equipment: Grab bars - tub/shower;Shower seat - built in;Hand held shower head;Grab bars - toilet;Transport chair Additional Comments: transport chair for night especially, primarily walking with family and gait belt    Prior Function Level of Independence: Needs assistance   Gait / Transfers Assistance Needed: ambulation  in home with use of gait belt, transport chair at night  ADL's / Homemaking Assistance Needed: pt relies on assist from family for all ADLs        Hand Dominance   Dominant Hand: Right    Extremity/Trunk Assessment   Upper Extremity Assessment Upper Extremity Assessment: Generalized weakness    Lower Extremity Assessment Lower Extremity Assessment: Generalized weakness    Cervical / Trunk Assessment Cervical / Trunk Assessment: Kyphotic  Communication      Cognition Arousal/Alertness: Awake/alert Behavior During Therapy: Flat affect;Restless Overall Cognitive Status: History of cognitive impairments - at baseline                                 General Comments: Pt with significant AD at baseline, oriented to self only. Pt with noted STM deficits, benefits from repeated, simple explanations. husband present and supprortive      General Comments  Pt husband present and very supportive. Improved patient compliance with husband providing HHA.         Assessment/Plan    PT Assessment Patient needs continued PT services  PT Problem List Decreased strength;Decreased mobility;Decreased safety awareness;Decreased cognition;Decreased activity tolerance;Decreased balance;Decreased knowledge of precautions       PT Treatment Interventions DME instruction;Gait training;Functional mobility training;Therapeutic activities;Patient/family education;Balance training;Therapeutic exercise    PT Goals (Current goals can  be found in the Care Plan section)  Acute Rehab PT Goals Patient Stated Goal: return home PT Goal Formulation: With patient/family Time For Goal Achievement: 03/21/20 Potential to Achieve Goals: Good    Frequency Min 2X/week   Barriers to discharge           AM-PAC PT "6 Clicks" Mobility  Outcome Measure Help needed turning from your back to your side while in a flat bed without using bedrails?: A Little Help needed moving from lying on your back to sitting on the side of a flat bed without using bedrails?: A Lot Help needed moving to and from a bed to a chair (including a wheelchair)?: A Lot Help needed standing up from a chair using your arms (e.g., wheelchair or bedside chair)?: A Lot Help needed to walk in hospital room?: A Lot Help needed climbing 3-5 steps with a railing? : Total 6 Click Score: 12    End of Session Equipment Utilized During Treatment: Gait belt Activity Tolerance: Patient tolerated treatment well Patient left: in bed;with family/visitor present;with call bell/phone within reach Nurse Communication: Mobility status PT Visit Diagnosis: Unsteadiness on feet (R26.81);Difficulty in walking, not elsewhere classified (R26.2);Muscle weakness (generalized) (M62.81)    Time: 8185-6314 PT Time Calculation (min) (ACUTE ONLY): 49 min   Charges:   PT Evaluation $PT Eval Moderate Complexity: 1 Mod PT Treatments $Gait Training: 8-22 mins $Therapeutic Activity: 8-22 mins        Karma Ganja, PT, DPT   Acute Rehabilitation Department Pager #: [phone]  Otho Bellows 03/07/2020, 10:34 AM

## 2020-03-07 NOTE — Progress Notes (Signed)
Patient ID: Meagan Schwartz, female   DOB: October 24, 1945, 74 y.o.   MRN: [id number]  PROGRESS NOTE    Meagan Schwartz  WUJ:[id number] DOB: 03-19-1946 DOA: 03/05/2020 PCP: Meagan Fess, MD    Brief Narrative:  Meagan Schwartz a 74 y.o.femalewith medical history significant ofadvanced Alzheimer's dementia (oriented to self only at baseline), hyperlipidemia, hypothyroidism presenting to the ED via EMS for evaluation of shaking and possible fever. No family available at this time. Husband reported to ED provider that patient has advanced dementia and is oriented to self only at baseline. She was a bit more lethargic than normal this morning. This morning he witnessed an episode of shaking all over which lasted approximately 30 minutes. Febrile upon arrival to the ED with axillary temperature 101 F.Not tachycardic or hypotensive. Labs showing mild leukocytosis (WBC count 12.5). Lactic acid normal x2. Urinalysis with positive nitrite, small amount of leukocytes, 21-50 WBCs, and many bacteria. Urine culture pending. Blood cultures + x 2 for E. Coli. SARS-CoV-2 PCR test negative. EKG with questionable borderline T wave abnormalities in lateral leads. High-sensitivity troponin mildly elevated 45 >69>57-->stable.Treated presumptively for Urosepsis Patient was given ceftriaxone, Tylenol, and a 500 cc fluid bolus.   Assessment & Plan:   Principal Problem:   UTI (urinary tract infection) Active Problems:   Dementia of the Alzheimer's type, with late onset, with depressed mood (HCC)   Sepsis ([place])   Elevated troponin   Hypothyroidism  Sepsis secondary to UTI: Febrile and blood cultures growing E. Coli. -Ceftriaxone x 7 days, may do home health IV at home once sensitivities are known, IV fluid hydration, and Tylenol as needed for fevers.   Troponinemia: High-sensitivity troponin mildly elevated 45>69.EKG with questionable borderline T wave abnormalities in lateral leads (no prior  tracing for comparison).Patient denies chest pain at present and appears comfortable. -Cardiac monitoring, trend troponin, and obtain echocardiogram-->ECHO normal.  Alzheimer's dementia -Continue Aricept and Namenda  Depression -Continue Celexa  Hypothyroidism -Continue Synthroid  Physical deconditioning -PT eval  DVT prophylaxis: Lovenox SQ Code Status: Full code  Family Communication: Husband at bedside Disposition Plan: Home with Home health   Consultants:   None  Procedures:  Echo - reveals EF 60-65%, normal valves, grade 1 DD  Antimicrobials: Anti-infectives (From admission, onward)   Start     Dose/Rate Route Frequency Ordered Stop   03/06/20 2130  cefTRIAXone (ROCEPHIN) 1 g in sodium chloride 0.9 % 100 mL IVPB  Status:  Discontinued        1 g 200 mL/hr over 30 Minutes Intravenous Every 24 hours 03/05/20 2323 03/06/20 0604   03/06/20 0800  cefTRIAXone (ROCEPHIN) 2 g in sodium chloride 0.9 % 100 mL IVPB     Discontinue     2 g 200 mL/hr over 30 Minutes Intravenous Every 24 hours 03/06/20 0604     03/05/20 2130  cefTRIAXone (ROCEPHIN) 1 g in sodium chloride 0.9 % 100 mL IVPB        1 g 200 mL/hr over 30 Minutes Intravenous  Once 03/05/20 2119 03/05/20 2254       Subjective: Awake and alert. No acute events. No complaints.  Objective: Vitals:   03/06/20 1803 03/06/20 2013 03/07/20 0200 03/07/20 0600  BP: 111/71 131/64 127/69 128/71  Pulse: 64 (!) 58 62 63  Resp: 19 18 18 18   Temp: (!) 100.5 F (38.1 C) 98.8 F (37.1 C) 98.7 F (37.1 C) 98.5 F (36.9 C)  TempSrc: Oral Oral Oral Oral  SpO2: 97% 96% 97%  98%  Weight:        Intake/Output Summary (Last 24 hours) at 03/07/2020 1529 Last data filed at 03/07/2020 0942 Gross per 24 hour  Intake 180 ml  Output 500 ml  Net -320 ml   Filed Weights   03/06/20 0804  Weight: 53.4 kg    Examination:  General exam: Appears calm and comfortable  Respiratory system: Clear to auscultation.  Respiratory effort normal. Cardiovascular system: S1 & S2 heard, RRR.  Gastrointestinal system: Abdomen is nondistended, soft and nontender.  Central nervous system: Alert and oriented. No focal neurological deficits. Extremities: Symmetric  Skin: No rashes Neuro: Oriented to self only  Data Reviewed: I have personally reviewed following labs and imaging studies  CBC: Recent Labs  Lab 03/05/20 1654  WBC 12.5*  NEUTROABS 11.2*  HGB 13.4  HCT 40.2  MCV 91.6  PLT 169   Basic Metabolic Panel: Recent Labs  Lab 03/05/20 1654  NA 134*  K 4.1  CL 101  CO2 24  GLUCOSE 126*  BUN 17  CREATININE 0.87  CALCIUM 9.1   GFR: Estimated Creatinine Clearance: 48.5 mL/min (by C-G formula based on SCr of 0.87 mg/dL). Liver Function Tests: Recent Labs  Lab 03/05/20 1654  AST 24  ALT 17  ALKPHOS 56  BILITOT 0.6  PROT 6.6  ALBUMIN 3.5   Coagulation Profile: Recent Labs  Lab 03/05/20 1654  INR 1.1   Sepsis Labs: Recent Labs  Lab 03/05/20 1805 03/05/20 2135  LATICACIDVEN 0.7 0.7    Recent Results (from the past 240 hour(s))  Culture, blood (routine x 2)     Status: Abnormal (Preliminary result)   Collection Time: 03/05/20  5:48 PM   Specimen: BLOOD RIGHT HAND  Result Value Ref Range Status   Specimen Description BLOOD RIGHT HAND  Final   Special Requests   Final    BOTTLES DRAWN AEROBIC AND ANAEROBIC Blood Culture adequate volume   Culture  Setup Time   Final    GRAM NEGATIVE RODS IN BOTH AEROBIC AND ANAEROBIC BOTTLES CRITICAL RESULT CALLED TO, READ BACK BY AND VERIFIED WITH: G. ABBOTT,PHARMD [id number] 03/06/2020 T. TYSOR    Culture (A)  Final    ESCHERICHIA COLI SUSCEPTIBILITIES TO FOLLOW Performed at [facility], [address], [place], [place] [address]    Report Status PENDING  Incomplete  Blood Culture ID Panel (Reflexed)     Status: Abnormal   Collection Time: 03/05/20  5:48 PM  Result Value Ref Range Status   Enterococcus species NOT DETECTED NOT  DETECTED Final   Listeria monocytogenes NOT DETECTED NOT DETECTED Final   Staphylococcus species NOT DETECTED NOT DETECTED Final   Staphylococcus aureus (BCID) NOT DETECTED NOT DETECTED Final   Streptococcus species NOT DETECTED NOT DETECTED Final   Streptococcus agalactiae NOT DETECTED NOT DETECTED Final   Streptococcus pneumoniae NOT DETECTED NOT DETECTED Final   Streptococcus pyogenes NOT DETECTED NOT DETECTED Final   Acinetobacter baumannii NOT DETECTED NOT DETECTED Final   Enterobacteriaceae species DETECTED (A) NOT DETECTED Final    Comment: Enterobacteriaceae represent a large family of gram-negative bacteria, not a single organism. CRITICAL RESULT CALLED TO, READ BACK BY AND VERIFIED WITH: G. ABBOTT,PHARMD [id number] 03/06/2020 T. TYSOR    Enterobacter cloacae complex NOT DETECTED NOT DETECTED Final   Escherichia coli DETECTED (A) NOT DETECTED Final    Comment: CRITICAL RESULT CALLED TO, READ BACK BY AND VERIFIED WITH: G. ABBOTT,PHARMD [id number] 03/06/2020 T. TYSOR    Klebsiella oxytoca NOT DETECTED NOT  DETECTED Final   Klebsiella pneumoniae NOT DETECTED NOT DETECTED Final   Proteus species NOT DETECTED NOT DETECTED Final   Serratia marcescens NOT DETECTED NOT DETECTED Final   Carbapenem resistance NOT DETECTED NOT DETECTED Final   Haemophilus influenzae NOT DETECTED NOT DETECTED Final   Neisseria meningitidis NOT DETECTED NOT DETECTED Final   Pseudomonas aeruginosa NOT DETECTED NOT DETECTED Final   Candida albicans NOT DETECTED NOT DETECTED Final   Candida glabrata NOT DETECTED NOT DETECTED Final   Candida krusei NOT DETECTED NOT DETECTED Final   Candida parapsilosis NOT DETECTED NOT DETECTED Final   Candida tropicalis NOT DETECTED NOT DETECTED Final    Comment: Performed at [facility], [place] [address] [place], [place] [address]  SARS Coronavirus 2 by RT PCR (hospital order, performed in [facility] hospital lab) Nasopharyngeal Nasopharyngeal Swab     Status: None   Collection  Time: 03/05/20  5:55 PM   Specimen: Nasopharyngeal Swab  Result Value Ref Range Status   SARS Coronavirus 2 NEGATIVE NEGATIVE Final    Comment: (NOTE) SARS-CoV-2 target nucleic acids are NOT DETECTED.  The SARS-CoV-2 RNA is generally detectable in upper and lower respiratory specimens during the acute phase of infection. The lowest concentration of SARS-CoV-2 viral copies this assay can detect is 250 copies / mL. A negative result does not preclude SARS-CoV-2 infection and should not be used as the sole basis for treatment or other patient management decisions.  A negative result may occur with improper specimen collection / handling, submission of specimen other than nasopharyngeal swab, presence of viral mutation(s) within the areas targeted by this assay, and inadequate number of viral copies (<250 copies / mL). A negative result must be combined with clinical observations, patient history, and epidemiological information.  Fact Sheet for Patients:   StrictlyIdeas.no  Fact Sheet for Healthcare Providers: BankingDealers.co.za  This test is not yet approved or  cleared by the [place] FDA and has been authorized for detection and/or diagnosis of SARS-CoV-2 by FDA under an Emergency Use Authorization (EUA).  This EUA will remain in effect (meaning this test can be used) for the duration of the COVID-19 declaration under Section 564(b)(1) of the Act, 21 U.S.C. section 360bbb-3(b)(1), unless the authorization is terminated or revoked sooner.  Performed at [facility], [place] [address] [place], [place] [address]   Culture, blood (routine x 2)     Status: None (Preliminary result)   Collection Time: 03/05/20  6:00 PM   Specimen: BLOOD LEFT HAND  Result Value Ref Range Status   Specimen Description BLOOD LEFT HAND  Final   Special Requests   Final    BOTTLES DRAWN AEROBIC AND ANAEROBIC Blood Culture adequate volume    Culture  Setup Time   Final    GRAM NEGATIVE RODS ANAEROBIC BOTTLE ONLY CRITICAL VALUE NOTED.  VALUE IS CONSISTENT WITH PREVIOUSLY REPORTED AND CALLED VALUE.    Culture   Final    GRAM NEGATIVE RODS CULTURE REINCUBATED FOR BETTER GROWTH Performed at [facility], [place] [address] [place],  [address]    Report Status PENDING  Incomplete  Urine culture     Status: Abnormal (Preliminary result)   Collection Time: 03/05/20  8:30 PM   Specimen: Urine, Catheterized  Result Value Ref Range Status   Specimen Description URINE, CATHETERIZED  Final   Special Requests NONE  Final   Culture (A)  Final    >=100,000 COLONIES/mL ESCHERICHIA COLI SUSCEPTIBILITIES TO FOLLOW Performed  at [facility], [place] [address] [place], [place] [address]    Report Status PENDING  Incomplete      Radiology Studies: DG Chest Port 1 View  Result Date: 03/05/2020 CLINICAL DATA:  74 year old female with fever. EXAM: PORTABLE CHEST 1 VIEW COMPARISON:  None. FINDINGS: Minimal left lung base atelectasis. No focal consolidation, pleural effusion, pneumothorax. There is mild cardiomegaly. Atherosclerotic calcification of the aorta. No acute osseous pathology. IMPRESSION: 1. No acute cardiopulmonary process. 2. Mild cardiomegaly. Electronically Signed   By: Anner Crete M.D.   On: 03/05/2020 16:38   ECHOCARDIOGRAM COMPLETE  Result Date: 03/06/2020    ECHOCARDIOGRAM REPORT   Patient Name:   COLENE MINES Pizzimenti Date of Exam: 03/06/2020 Medical Rec #:  [id number]      Height:       64.0 in Accession #:    [id number]     Weight:       117.7 lb Date of Birth:  September 08, 1946      BSA:          1.562 m Patient Age:    66 years       BP:           136/71 mmHg Patient Gender: F              HR:           65 bpm. Exam Location:  Inpatient Procedure: 2D Echo Indications:    elevated troponin  History:        Patient has no prior history of Echocardiogram examinations.                 Risk Factors:Dyslipidemia and Former  Smoker.  Sonographer:    Jannett Celestine RDCS (AE) Referring Phys: [id number] Shela Leff  Sonographer Comments: see comments IMPRESSIONS  1. Left ventricular ejection fraction, by estimation, is 60 to 65%. The left ventricle has normal function. The left ventricle has no regional wall motion abnormalities. Left ventricular diastolic parameters are consistent with Grade I diastolic dysfunction (impaired relaxation).  2. Right ventricular systolic function is normal. The right ventricular size is normal. There is normal pulmonary artery systolic pressure.  3. The mitral valve is normal in structure. Trivial mitral valve regurgitation. No evidence of mitral stenosis.  4. The aortic valve is normal in structure. Aortic valve regurgitation is mild. Mild to moderate aortic valve sclerosis/calcification is present, without any evidence of aortic stenosis.  5. The inferior vena cava is normal in size with greater than 50% respiratory variability, suggesting right atrial pressure of 3 mmHg. FINDINGS  Left Ventricle: Left ventricular ejection fraction, by estimation, is 60 to 65%. The left ventricle has normal function. The left ventricle has no regional wall motion abnormalities. The left ventricular internal cavity size was normal in size. There is  no left ventricular hypertrophy. Left ventricular diastolic parameters are consistent with Grade I diastolic dysfunction (impaired relaxation). Normal left ventricular filling pressure. Right Ventricle: The right ventricular size is normal. No increase in right ventricular wall thickness. Right ventricular systolic function is normal. There is normal pulmonary artery systolic pressure. Left Atrium: Left atrial size was normal in size. Right Atrium: Right atrial size was normal in size. Pericardium: There is no evidence of pericardial effusion. Mitral Valve: The mitral valve is normal in structure. Normal mobility of the mitral valve leaflets. Moderate mitral annular  calcification. Trivial mitral valve regurgitation. No evidence of mitral valve stenosis. Tricuspid Valve: The tricuspid valve is normal in structure.  Tricuspid valve regurgitation is not demonstrated. No evidence of tricuspid stenosis. Aortic Valve: The aortic valve is normal in structure.. There is mild thickening and mild calcification of the aortic valve. Aortic valve regurgitation is mild. Mild to moderate aortic valve sclerosis/calcification is present, without any evidence of aortic stenosis. There is mild thickening of the aortic valve. There is mild calcification of the aortic valve. Pulmonic Valve: The pulmonic valve was normal in structure. Pulmonic valve regurgitation is not visualized. No evidence of pulmonic stenosis. Aorta: The aortic root is normal in size and structure. Venous: The inferior vena cava is normal in size with greater than 50% respiratory variability, suggesting right atrial pressure of 3 mmHg. IAS/Shunts: No atrial level shunt detected by color flow Doppler.  LEFT VENTRICLE PLAX 2D LVIDd:         4.60 cm LVIDs:         2.90 cm LV PW:         0.70 cm LV IVS:        0.80 cm LVOT diam:     2.10 cm LV SV:         48 LV SV Index:   31 LVOT Area:     3.46 cm  LEFT ATRIUM             Index LA diam:        3.00 cm 1.92 cm/m LA Vol (A2C):   28.6 ml 18.31 ml/m LA Vol (A4C):   33.7 ml 21.58 ml/m LA Biplane Vol: 34.3 ml 21.96 ml/m  AORTIC VALVE LVOT Vmax:   77.50 cm/s LVOT Vmean:  53.500 cm/s LVOT VTI:    0.138 m  AORTA Ao Root diam: 3.00 cm MITRAL VALVE MV Area (PHT): 3.06 cm    SHUNTS MV Decel Time: 248 msec    Systemic VTI:  0.14 m MV E velocity: 63.00 cm/s  Systemic Diam: 2.10 cm MV A velocity: 72.00 cm/s MV E/A ratio:  0.88 Ena Dawley MD Electronically signed by Ena Dawley MD Signature Date/Time: 03/06/2020/4:02:19 PM    Final      Scheduled Meds: . citalopram  10 mg Oral Daily  . donepezil  10 mg Oral QHS  . enoxaparin (LOVENOX) injection  40 mg Subcutaneous Q24H  .  levothyroxine  75 mcg Oral QAC breakfast  . memantine  10 mg Oral BID  . multivitamin  1 tablet Oral Q lunch   Continuous Infusions: . cefTRIAXone (ROCEPHIN)  IV 2 g (03/07/20 1019)     LOS: 2 days    Donnamae Jude, MD 03/07/2020 3:29 PM [phone] [facility] If 7PM-7AM, please contact night-coverage 03/07/2020, 3:29 PM

## 2020-03-07 NOTE — TOC Initial Note (Signed)
Transition of Care [facility]) - Initial/Assessment Note    Patient Details  Name: Meagan Schwartz MRN: [id number] Date of Birth: [birth year]  Transition of Care [facility]) CM/SW Contact:    Carles Collet, RN Phone Number: 03/07/2020, 11:37 AM  Clinical Narrative:         Damaris Schooner w spouse and attending at bedside. Spouse would like to take patient home at DC.  Spouse will resume HHA servies from 8p to 8a 7 days a week. Patient will need [place] services RN (potential for IV Abx) PT and HHA. Chose [facility], referral accepted. (Will need HH order with face to face) Pam w [facility] notified of potential for IV Abx at DC. (Will need OPAT order) Spoke of outpatoent palliative services in setting of Alzheimers and managing a chronic debilitating disease. SPouse agreeable and referral placed to [place]. Patient has needed DME at home. TOC will continue to follow            Expected Discharge Plan: [place] Barriers to Discharge: Continued Medical Work up   Patient Goals and CMS Choice Patient states their goals for this hospitalization and ongoing recovery are:: per spouse, would like to return home CMS Medicare.gov Compare Post Acute Care list provided to:: Other (Comment Required) Choice offered to / list presented to : Spouse  Expected Discharge Plan and Services Expected Discharge Plan: [place]   Discharge Planning Services: CM Consult Post Acute Care Choice: [place] arrangements for the past 2 months: Single Family Home                           HH Arranged: RN, PT, Nurse's Aide [place] Agency: [place] ([place]) Date HH Agency Contacted: 03/07/20 Time [place]: [id number] Representative spoke with at [place]: Butch Penny  Prior Living Arrangements/Services Living arrangements for the past 2 months: [place] Lives with:: Spouse Patient language and need for interpreter reviewed:: Yes Do you feel safe  going back to the place where you live?: Yes      Need for Family Participation in Patient Care: Yes (Comment)   Current home services: Homehealth aide Criminal Activity/Legal Involvement Pertinent to Current Situation/Hospitalization: No - Comment as needed  Activities of Daily Living      Permission Sought/Granted                  Emotional Assessment       Orientation: : Oriented to Self   Psych Involvement: No (comment)  Admission diagnosis:  UTI (urinary tract infection) [N39.0] Fever, unspecified fever cause [R50.9] Urinary tract infection without hematuria, site unspecified [N39.0] Patient Active Problem List   Diagnosis Date Noted  . UTI (urinary tract infection) 03/05/2020  . Sepsis ([employer]) 03/05/2020  . Elevated troponin 03/05/2020  . Hypothyroidism 03/05/2020  . Dementia of the Alzheimer's type, with late onset, with depressed mood ([place]) 10/14/2017  . Cognitive deficits 02/20/2017  . Postural dizziness with presyncope 02/20/2017  . Hx of colonic polyps 10/04/2009   PCP:  Hulan Fess, MD Pharmacy:   [facility], [address] [place] [address] Phone: [phone] Fax: [phone]     Social Determinants of Health (SDOH) Interventions    Readmission Risk Interventions No flowsheet data found.

## 2020-03-08 ENCOUNTER — Inpatient Hospital Stay: Payer: Self-pay

## 2020-03-08 LAB — CULTURE, BLOOD (ROUTINE X 2)
Special Requests: ADEQUATE
Special Requests: ADEQUATE

## 2020-03-08 LAB — URINE CULTURE: Culture: >=100000 — AB

## 2020-03-08 MED ORDER — AMOXICILLIN 500 MG PO CAPS
500.0000 mg | ORAL_CAPSULE | Freq: Three times a day (TID) | ORAL | 0 refills | Status: AC
Start: 2020-03-09 — End: 2020-03-12

## 2020-03-08 NOTE — Discharge Instructions (Signed)
Urosepsis, Adult  Urosepsis is a type of sepsis. Sepsis is a severe bodily reaction to an infection. Urosepsis is caused by a bacterial infection that starts in the urinary tract and spreads to the blood. The urinary tract is the system where urine is made, stored, and passed out of the body and includes the kidneys, ureters, bladder, and urethra. This may also be called the urinary system. In severe cases, sepsis can lead to septic shock. Septic shock can weaken your heart and cause your blood pressure to drop. This can make the body's central nervous system and other vital organs stop working. Urosepsis is a medical emergency that requires immediate treatment in a hospital. What are the causes? Common causes of this condition include:  A urinary tract infection (UTI) that spreads to your blood.  A urinary tract blockage due to kidney stones.  Swelling and inflammation of the prostate (prostatitis) or prostate infection, in males. What increases the risk? You are more likely to develop this condition if you:  Are female, especially if you are sexually active.  Are age 65 or older.  Have a long-term disease, such as kidney disease or diabetes.  Have a weak disease-fighting system (immune system).  Have a condition that lessens or changes urine flow, such as a kidney or bladder stone, prostate disease, or a tumor in the urinary tract.  Have had surgery in an area of the urinary tract.  Have a small, thin tube in your urethra that drains urine from your bladder for a period of time (indwelling urinary catheter).  Have lost feeling below the waist or are in a wheelchair. What are the signs or symptoms? Early symptoms of this condition are similar to symptoms of a severe UTI. Common symptoms of this condition include:  Pain in your side, back, or lower abdomen.  Fever and chills.  Nausea and vomiting.  Frequent need to pass urine.  Burning pain when passing urine.  Bloody or  cloudy urine.  Bad-smelling urine.  Fatigue.  Trouble passing urine or not being able to pass urine at all. Once the infection has spread to the blood and a sepsis reaction starts, other symptoms may include:  Chills with shaking.  Cold and clammy skin.  A fever of 101.3F (38.5C) or higher.  Low body temperature of 96.8F (36C) or lower.  Fast breathing.  Trouble breathing.  Fast heartbeat.  Severe pain in the abdomen.  Muscle aches.  Anxiety.  Problems staying awake.  Confusion.  Fainting. How is this diagnosed? This condition is diagnosed based on your symptoms, your medical history, and a physical exam. You may also have:  Urine tests or blood tests to check kidney function and to look for infection.  Imaging tests such as a CT scan or ultrasound to check for blockages in the urinary system. How is this treated? This condition is a medical emergency that needs to be treated right away in the hospital. This condition may be treated with:  An IV so that you can quickly receive: ? Antibiotic medicines. ? Fluids. ? Medicines to support blood pressure.  Oxygen and breathing support, if needed.  Removing a urinary catheter if it is the source of the infection, if this applies.  Filtering your blood with a machine (dialysis). This process cleans your blood if your kidneys have failed.  Surgery to drain infected areas or restore urine flow. This is rare. Follow these instructions at home: Medicines  Take over-the-counter and prescription medicines only as told   by your health care provider.  If you were prescribed an antibiotic medicine, take it as told by your health care provider. Do not stop using the antibiotic even if you start to feel better. General instructions   Drink enough fluid to keep your urine pale yellow.  Return to your normal activities as told by your health care provider. Ask your health care provider what activities are safe for  you.  Keep all follow-up visits as told by your health care provider. This is important. Contact a health care provider if:  You have symptoms that get worse or do not get better with treatment.  You have new UTI symptoms. Get help right away if:  You have new or continued symptoms of sepsis after hospitalization, such as: ? A fever of 101.3F (38.5C) or higher. ? Low body temperature of 96.8F (36C) or lower. ? Chills. ? Severe pain. ? Difficulty breathing. ? Confusion. ? Sleepiness. ? Nausea and vomiting. These symptoms may represent a serious problem that is an emergency. Do not wait to see if the symptoms will go away. Get medical help right away. Call your local emergency services (911 in the [place] Do not drive yourself to the hospital. Summary  Urosepsis is a type of sepsis. Sepsis is a severe bodily reaction to an infection.  Urosepsis is a medical emergency that requires immediate treatment in a hospital.  Possible causes of urosepsis include a urinary tract infection that spreads to your blood, blockage from kidney stones, and prostate swelling or infection in males.  This condition may be treated with an IV so that you can quickly receive antibiotic medicines, fluids, and medicines to support your blood pressure. Other treatments may be used as well.  Get help right away if you have new or continued symptoms of sepsis after hospitalization. This information is not intended to replace advice given to you by your health care provider. Make sure you discuss any questions you have with your health care provider. Document Revised: 10/01/2018 Document Reviewed: 10/02/2018 Elsevier Patient Education  2020 [employer]  

## 2020-03-08 NOTE — Progress Notes (Signed)
PHARMACY CONSULT NOTE FOR:  OUTPATIENT  PARENTERAL ANTIBIOTIC THERAPY (OPAT)  Indication: Ecoli bacteremia and UTI Regimen: Amoxicillin 500mg  TID  End date: 6/25   Discussed antibiotic course with Hospitalist, ID pharmacist and ID Physician and g iven pan-sensitive Ecoli and s/p 4 days of IV ceftriaxone, agreed to discharge on oral amoxicillin 500mg  TID for 3 more days.   Updated discharge med comments to start amoxicillin on 6/23.   Thank you for allowing pharmacy to be a part of this patient's care.  Benetta Spar, PharmD, BCPS, BCCP Clinical Pharmacist  Please check AMION for all [place] phone numbers After 10:00 PM, call [place] [phone]

## 2020-03-08 NOTE — Discharge Summary (Signed)
Physician Discharge Summary  Meagan Schwartz EYC:[id number] DOB: July 23, 1946 DOA: 03/05/2020  PCP: Hulan Fess, MD  Admit date: 03/05/2020 Discharge date: 03/08/2020  Admitted From: home Disposition:  home  Recommendations for Outpatient Follow-up:  1. Follow up with PCP in 1-2 weeks 2. Please obtain BMP/CBC in one week if needed 3. Please follow up on the following pending results:  Home Health: Yes Equipment/Devices:None   Discharge Condition: Stable CODE STATUS: Full code Diet recommendation: Heart healthy  Brief/Interim Summary: Meagan A Mischenis a 74 y.o.femalewith medical history significant ofadvanced Alzheimer's dementia (oriented to self only at baseline), hyperlipidemia, hypothyroidism presenting to the ED via EMS for evaluation of shaking and possible fever. No family available at this time. Husband reported to ED provider that patient has advanced dementia and is oriented to self only at baseline. She was a bit more lethargic than normal this morning. This morning he witnessed an episode of shaking all over which lasted approximately 30 minutes. Febrile upon arrival to the ED with axillary temperature 101 F.Not tachycardic or hypotensive. Labs showing mild leukocytosis (WBC count 12.5). Lactic acid normal x2. Urinalysis with positive nitrite, small amount of leukocytes, 21-50 WBCs, and many bacteria. Urine culture pending.Blood cultures + x 2 for E. Coli.SARS-CoV-2 PCR test negative. EKG with questionable borderline T wave abnormalities in lateral leads. High-sensitivity troponin mildly elevated 45 >69>57-->stable.Treated presumptively for Urosepsis Patient was given ceftriaxone, Tylenol, and a 500 cc fluid bolus.  Discharge Diagnoses:  Principal Problem:   UTI (urinary tract infection) Active Problems:   Dementia of the Alzheimer's type, with late onset, with depressed mood (HCC)   Sepsis ([place])   Elevated troponin   Hypothyroidism  Sepsis secondary to  UTI: Febrile andblood cultures growing E. Coli. -Ceftriaxonex 4 days, switched to Amoxil 500 TID for completion of 7 day course per J. [name] Salon ID  Troponinemia: High-sensitivity troponin mildly elevated 45>69>57 stable.EKG with questionable borderline T wave abnormalities in lateral leads (no prior tracing for comparison).Patient denies chest pain at present and appears comfortable. -Cardiac monitoring, trend troponin, and obtain echocardiogram-->ECHO normal.  Alzheimer's dementia -Continue Aricept and Namenda  Depression -Continue Celexa  Hypothyroidism -Continue Synthroid  Physical deconditioning -PT eval--home health PT  Discharge Instructions:  Discharge Instructions    Call MD for:  difficulty breathing, headache or visual disturbances   Complete by: As directed    Call MD for:  persistant dizziness or light-headedness   Complete by: As directed    Call MD for:  persistant nausea and vomiting   Complete by: As directed    Call MD for:  severe uncontrolled pain   Complete by: As directed    Call MD for:  temperature >100.4   Complete by: As directed    Diet - low sodium heart healthy   Complete by: As directed    Increase activity slowly   Complete by: As directed    Increase activity slowly   Complete by: As directed    No wound care   Complete by: As directed      Allergies as of 03/08/2020      Reactions   Clindamycin Other (See Comments)   Unknown reaction      Medication List    TAKE these medications   amoxicillin 500 MG capsule Commonly known as: AMOXIL Take 1 capsule (500 mg total) by mouth 3 (three) times daily for 3 days. Start taking on: March 09, 2020   citalopram 10 MG tablet Commonly known as: CeleXA Take 1 tablet (10  mg total) by mouth daily.   donepezil 10 MG tablet Commonly known as: ARICEPT TAKE 1 TABLET BY MOUTH AT BEDTIME   levothyroxine 75 MCG tablet Commonly known as: SYNTHROID Take 75 mcg by mouth daily before  breakfast.   melatonin 5 MG Tabs Take 10 mg by mouth at bedtime.   memantine 10 MG tablet Commonly known as: NAMENDA Take 1 tablet (10 mg total) by mouth 2 (two) times daily.   Ocuvite Adult 50+ Caps Take 1 capsule by mouth daily with lunch.   OVER THE COUNTER MEDICATION Take by mouth See admin instructions. [employer] Recode Alzheimer's regimen of probotics, vitamins, omega 3, MCT oil, etc - take 8 capsules by mouth with each meal, Mix 2 scoops of morning balance powder in almond milk and drink every morning, mix 1 scoop of evening balance in water and drink every evening.       Follow-up Information    [facility] Follow up.   Why: For home palliative services. They will contact you after you get home.  Contact information: [place], [facility] Follow up.   Why: They will contact you to set up your first home health appointment Contact information: [place] [address] [phone]        Hulan Fess, MD Follow up.   Specialty: Family Medicine Contact information: [place] [address] [phone]              Allergies  Allergen Reactions  . Clindamycin Other (See Comments)    Unknown reaction    Consultations:  ID Curbside only   Procedures/Studies:  Echo - reveals EF 60-65%, normal valves, grade 1 DD DG Chest Port 1 View  Result Date: 03/05/2020 CLINICAL DATA:  74 year old female with fever. EXAM: PORTABLE CHEST 1 VIEW COMPARISON:  None. FINDINGS: Minimal left lung base atelectasis. No focal consolidation, pleural effusion, pneumothorax. There is mild cardiomegaly. Atherosclerotic calcification of the aorta. No acute osseous pathology. IMPRESSION: 1. No acute cardiopulmonary process. 2. Mild cardiomegaly. Electronically Signed   By: Anner Crete M.D.   On: 03/05/2020 16:38   ECHOCARDIOGRAM  COMPLETE  Result Date: 03/06/2020    ECHOCARDIOGRAM REPORT   Patient Name:   Meagan Schwartz Date of Exam: 03/06/2020 Medical Rec #:  [id number]      Height:       64.0 in Accession #:    [id number]     Weight:       117.7 lb Date of Birth:  03-03-1946      BSA:          1.562 m Patient Age:    75 years       BP:           136/71 mmHg Patient Gender: F              HR:           65 bpm. Exam Location:  Inpatient Procedure: 2D Echo Indications:    elevated troponin  History:        Patient has no prior history of Echocardiogram examinations.                 Risk Factors:Dyslipidemia and Former Smoker.  Sonographer:    Jannett Celestine RDCS (AE) Referring Phys: [id number] Shela Leff  Sonographer Comments: see comments IMPRESSIONS  1. Left ventricular ejection fraction, by estimation, is  60 to 65%. The left ventricle has normal function. The left ventricle has no regional wall motion abnormalities. Left ventricular diastolic parameters are consistent with Grade I diastolic dysfunction (impaired relaxation).  2. Right ventricular systolic function is normal. The right ventricular size is normal. There is normal pulmonary artery systolic pressure.  3. The mitral valve is normal in structure. Trivial mitral valve regurgitation. No evidence of mitral stenosis.  4. The aortic valve is normal in structure. Aortic valve regurgitation is mild. Mild to moderate aortic valve sclerosis/calcification is present, without any evidence of aortic stenosis.  5. The inferior vena cava is normal in size with greater than 50% respiratory variability, suggesting right atrial pressure of 3 mmHg. FINDINGS  Left Ventricle: Left ventricular ejection fraction, by estimation, is 60 to 65%. The left ventricle has normal function. The left ventricle has no regional wall motion abnormalities. The left ventricular internal cavity size was normal in size. There is  no left ventricular hypertrophy. Left ventricular diastolic parameters are consistent  with Grade I diastolic dysfunction (impaired relaxation). Normal left ventricular filling pressure. Right Ventricle: The right ventricular size is normal. No increase in right ventricular wall thickness. Right ventricular systolic function is normal. There is normal pulmonary artery systolic pressure. Left Atrium: Left atrial size was normal in size. Right Atrium: Right atrial size was normal in size. Pericardium: There is no evidence of pericardial effusion. Mitral Valve: The mitral valve is normal in structure. Normal mobility of the mitral valve leaflets. Moderate mitral annular calcification. Trivial mitral valve regurgitation. No evidence of mitral valve stenosis. Tricuspid Valve: The tricuspid valve is normal in structure. Tricuspid valve regurgitation is not demonstrated. No evidence of tricuspid stenosis. Aortic Valve: The aortic valve is normal in structure.. There is mild thickening and mild calcification of the aortic valve. Aortic valve regurgitation is mild. Mild to moderate aortic valve sclerosis/calcification is present, without any evidence of aortic stenosis. There is mild thickening of the aortic valve. There is mild calcification of the aortic valve. Pulmonic Valve: The pulmonic valve was normal in structure. Pulmonic valve regurgitation is not visualized. No evidence of pulmonic stenosis. Aorta: The aortic root is normal in size and structure. Venous: The inferior vena cava is normal in size with greater than 50% respiratory variability, suggesting right atrial pressure of 3 mmHg. IAS/Shunts: No atrial level shunt detected by color flow Doppler.  LEFT VENTRICLE PLAX 2D LVIDd:         4.60 cm LVIDs:         2.90 cm LV PW:         0.70 cm LV IVS:        0.80 cm LVOT diam:     2.10 cm LV SV:         48 LV SV Index:   31 LVOT Area:     3.46 cm  LEFT ATRIUM             Index LA diam:        3.00 cm 1.92 cm/m LA Vol (A2C):   28.6 ml 18.31 ml/m LA Vol (A4C):   33.7 ml 21.58 ml/m LA Biplane Vol: 34.3  ml 21.96 ml/m  AORTIC VALVE LVOT Vmax:   77.50 cm/s LVOT Vmean:  53.500 cm/s LVOT VTI:    0.138 m  AORTA Ao Root diam: 3.00 cm MITRAL VALVE MV Area (PHT): 3.06 cm    SHUNTS MV Decel Time: 248 msec    Systemic VTI:  0.14 m MV E velocity:  63.00 cm/s  Systemic Diam: 2.10 cm MV A velocity: 72.00 cm/s MV E/A ratio:  0.88 Ena Dawley MD Electronically signed by Ena Dawley MD Signature Date/Time: 03/06/2020/4:02:19 PM    Final    [place] EKG SITE RITE  Result Date: 03/08/2020 If Site Rite image not attached, placement could not be confirmed due to current cardiac rhythm.      Subjective: Feels better. Back to baseline  Discharge Exam: Vitals:   03/07/20 2025 03/08/20 0426  BP: (!) 152/76 (!) 162/81  Pulse: 68 62  Resp: 17 15  Temp: 99.3 F (37.4 C) 98.9 F (37.2 C)  SpO2: 97% 98%   Vitals:   03/07/20 0600 03/07/20 1544 03/07/20 2025 03/08/20 0426  BP: 128/71 (!) 149/80 (!) 152/76 (!) 162/81  Pulse: 63 76 68 62  Resp: 18 18 17 15   Temp: 98.5 F (36.9 C) 100.3 F (37.9 C) 99.3 F (37.4 C) 98.9 F (37.2 C)  TempSrc: Oral Oral Oral Oral  SpO2: 98% 97% 97% 98%  Weight:        General: Pt is alert, awake, not in acute distress Cardiovascular: RRR, S1/S2 +, no rubs, no gallops Respiratory: CTA bilaterally, no wheezing, no rhonchi Abdominal: Soft, NT, ND, bowel sounds + Extremities: no edema, no cyanosis    The results of significant diagnostics from this hospitalization (including imaging, microbiology, ancillary and laboratory) are listed below for reference.     Microbiology: Recent Results (from the past 240 hour(s))  Culture, blood (routine x 2)     Status: Abnormal   Collection Time: 03/05/20  5:48 PM   Specimen: BLOOD RIGHT HAND  Result Value Ref Range Status   Specimen Description BLOOD RIGHT HAND  Final   Special Requests   Final    BOTTLES DRAWN AEROBIC AND ANAEROBIC Blood Culture adequate volume   Culture  Setup Time   Final    GRAM NEGATIVE RODS IN BOTH  AEROBIC AND ANAEROBIC BOTTLES CRITICAL RESULT CALLED TO, READ BACK BY AND VERIFIED WITH: Salli Real [id number] 03/06/2020 Mena Goes Performed at [facility], [place] [address], [place], [place] [address]    Culture ESCHERICHIA COLI (A)  Final   Report Status 03/08/2020 FINAL  Final   Organism ID, Bacteria ESCHERICHIA COLI  Final      Susceptibility   Escherichia coli - MIC*    AMPICILLIN <=2 SENSITIVE Sensitive     CEFAZOLIN <=4 SENSITIVE Sensitive     CEFEPIME <=0.12 SENSITIVE Sensitive     CEFTAZIDIME <=1 SENSITIVE Sensitive     CEFTRIAXONE <=0.25 SENSITIVE Sensitive     CIPROFLOXACIN <=0.25 SENSITIVE Sensitive     GENTAMICIN <=1 SENSITIVE Sensitive     IMIPENEM <=0.25 SENSITIVE Sensitive     TRIMETH/SULFA <=20 SENSITIVE Sensitive     AMPICILLIN/SULBACTAM <=2 SENSITIVE Sensitive     PIP/TAZO <=4 SENSITIVE Sensitive     * ESCHERICHIA COLI  Blood Culture ID Panel (Reflexed)     Status: Abnormal   Collection Time: 03/05/20  5:48 PM  Result Value Ref Range Status   Enterococcus species NOT DETECTED NOT DETECTED Final   Listeria monocytogenes NOT DETECTED NOT DETECTED Final   Staphylococcus species NOT DETECTED NOT DETECTED Final   Staphylococcus aureus (BCID) NOT DETECTED NOT DETECTED Final   Streptococcus species NOT DETECTED NOT DETECTED Final   Streptococcus agalactiae NOT DETECTED NOT DETECTED Final   Streptococcus pneumoniae NOT DETECTED NOT DETECTED Final   Streptococcus pyogenes NOT DETECTED NOT DETECTED Final   Acinetobacter baumannii NOT  DETECTED NOT DETECTED Final   Enterobacteriaceae species DETECTED (A) NOT DETECTED Final    Comment: Enterobacteriaceae represent a large family of gram-negative bacteria, not a single organism. CRITICAL RESULT CALLED TO, READ BACK BY AND VERIFIED WITH: G. ABBOTT,PHARMD [id number] 03/06/2020 T. TYSOR    Enterobacter cloacae complex NOT DETECTED NOT DETECTED Final   Escherichia coli DETECTED (A) NOT DETECTED Final    Comment: CRITICAL  RESULT CALLED TO, READ BACK BY AND VERIFIED WITH: G. ABBOTT,PHARMD [id number] 03/06/2020 T. TYSOR    Klebsiella oxytoca NOT DETECTED NOT DETECTED Final   Klebsiella pneumoniae NOT DETECTED NOT DETECTED Final   Proteus species NOT DETECTED NOT DETECTED Final   Serratia marcescens NOT DETECTED NOT DETECTED Final   Carbapenem resistance NOT DETECTED NOT DETECTED Final   Haemophilus influenzae NOT DETECTED NOT DETECTED Final   Neisseria meningitidis NOT DETECTED NOT DETECTED Final   Pseudomonas aeruginosa NOT DETECTED NOT DETECTED Final   Candida albicans NOT DETECTED NOT DETECTED Final   Candida glabrata NOT DETECTED NOT DETECTED Final   Candida krusei NOT DETECTED NOT DETECTED Final   Candida parapsilosis NOT DETECTED NOT DETECTED Final   Candida tropicalis NOT DETECTED NOT DETECTED Final    Comment: Performed at [facility], [place] [address], [place], [place] [address]  SARS Coronavirus 2 by RT PCR (hospital order, performed in [facility] hospital lab) Nasopharyngeal Nasopharyngeal Swab     Status: None   Collection Time: 03/05/20  5:55 PM   Specimen: Nasopharyngeal Swab  Result Value Ref Range Status   SARS Coronavirus 2 NEGATIVE NEGATIVE Final    Comment: (NOTE) SARS-CoV-2 target nucleic acids are NOT DETECTED.  The SARS-CoV-2 RNA is generally detectable in upper and lower respiratory specimens during the acute phase of infection. The lowest concentration of SARS-CoV-2 viral copies this assay can detect is 250 copies / mL. A negative result does not preclude SARS-CoV-2 infection and should not be used as the sole basis for treatment or other patient management decisions.  A negative result may occur with improper specimen collection / handling, submission of specimen other than nasopharyngeal swab, presence of viral mutation(s) within the areas targeted by this assay, and inadequate number of viral copies (<250 copies / mL). A negative result must be combined with  clinical observations, patient history, and epidemiological information.  Fact Sheet for Patients:   StrictlyIdeas.no  Fact Sheet for Healthcare Providers: BankingDealers.co.za  This test is not yet approved or  cleared by the [place] FDA and has been authorized for detection and/or diagnosis of SARS-CoV-2 by FDA under an Emergency Use Authorization (EUA).  This EUA will remain in effect (meaning this test can be used) for the duration of the COVID-19 declaration under Section 564(b)(1) of the Act, 21 U.S.C. section 360bbb-3(b)(1), unless the authorization is terminated or revoked sooner.  Performed at [facility], [place] [address] [place],  [address]   Culture, blood (routine x 2)     Status: Abnormal   Collection Time: 03/05/20  6:00 PM   Specimen: BLOOD LEFT HAND  Result Value Ref Range Status   Specimen Description BLOOD LEFT HAND  Final   Special Requests   Final    BOTTLES DRAWN AEROBIC AND ANAEROBIC Blood Culture adequate volume   Culture  Setup Time   Final    GRAM NEGATIVE RODS ANAEROBIC BOTTLE ONLY CRITICAL VALUE NOTED.  VALUE IS CONSISTENT WITH PREVIOUSLY REPORTED AND CALLED VALUE.    Culture (A)  Final  ESCHERICHIA COLI SUSCEPTIBILITIES PERFORMED ON PREVIOUS CULTURE WITHIN THE LAST 5 DAYS. Performed at [facility], [place] [address], [place], [place] [address]    Report Status 03/08/2020 FINAL  Final  Urine culture     Status: Abnormal   Collection Time: 03/05/20  8:30 PM   Specimen: Urine, Catheterized  Result Value Ref Range Status   Specimen Description URINE, CATHETERIZED  Final   Special Requests   Final    NONE Performed at [facility], [place] [address], [place],  [address]    Culture >=100,000 COLONIES/mL ESCHERICHIA COLI (A)  Final   Report Status 03/08/2020 FINAL  Final   Organism ID, Bacteria ESCHERICHIA COLI (A)  Final      Susceptibility   Escherichia coli -  MIC*    AMPICILLIN <=2 SENSITIVE Sensitive     CEFAZOLIN <=4 SENSITIVE Sensitive     CEFTRIAXONE <=0.25 SENSITIVE Sensitive     CIPROFLOXACIN <=0.25 SENSITIVE Sensitive     GENTAMICIN <=1 SENSITIVE Sensitive     IMIPENEM <=0.25 SENSITIVE Sensitive     NITROFURANTOIN <=16 SENSITIVE Sensitive     TRIMETH/SULFA <=20 SENSITIVE Sensitive     AMPICILLIN/SULBACTAM <=2 SENSITIVE Sensitive     PIP/TAZO <=4 SENSITIVE Sensitive     * >=100,000 COLONIES/mL ESCHERICHIA COLI     Labs: Basic Metabolic Panel: Recent Labs  Lab 03/05/20 1654  NA 134*  K 4.1  CL 101  CO2 24  GLUCOSE 126*  BUN 17  CREATININE 0.87  CALCIUM 9.1   Liver Function Tests: Recent Labs  Lab 03/05/20 1654  AST 24  ALT 17  ALKPHOS 56  BILITOT 0.6  PROT 6.6  ALBUMIN 3.5   CBC: Recent Labs  Lab 03/05/20 1654  WBC 12.5*  NEUTROABS 11.2*  HGB 13.4  HCT 40.2  MCV 91.6  PLT 210   Urinalysis    Component Value Date/Time   COLORURINE YELLOW 03/05/2020 2030   APPEARANCEUR HAZY (A) 03/05/2020 2030   LABSPEC 1.013 03/05/2020 2030   PHURINE 6.0 03/05/2020 2030   GLUCOSEU NEGATIVE 03/05/2020 2030   HGBUR NEGATIVE 03/05/2020 2030   [place] NEGATIVE 03/05/2020 2030   [place] 03/05/2020 2030   PROTEINUR 30 (A) 03/05/2020 2030   NITRITE POSITIVE (A) 03/05/2020 2030   LEUKOCYTESUR SMALL (A) 03/05/2020 2030   Microbiology Recent Results (from the past 240 hour(s))  Culture, blood (routine x 2)     Status: Abnormal   Collection Time: 03/05/20  5:48 PM   Specimen: BLOOD RIGHT HAND  Result Value Ref Range Status   Specimen Description BLOOD RIGHT HAND  Final   Special Requests   Final    BOTTLES DRAWN AEROBIC AND ANAEROBIC Blood Culture adequate volume   Culture  Setup Time   Final    GRAM NEGATIVE RODS IN BOTH AEROBIC AND ANAEROBIC BOTTLES CRITICAL RESULT CALLED TO, READ BACK BY AND VERIFIED WITH: Salli Real [id number] 03/06/2020 Mena Goes Performed at [facility], [address] [place], [place] [address]    Culture ESCHERICHIA COLI (A)  Final   Report Status 03/08/2020 FINAL  Final   Organism ID, Bacteria ESCHERICHIA COLI  Final      Susceptibility   Escherichia coli - MIC*    AMPICILLIN <=2 SENSITIVE Sensitive     CEFAZOLIN <=4 SENSITIVE Sensitive     CEFEPIME <=0.12 SENSITIVE Sensitive     CEFTAZIDIME <=1 SENSITIVE Sensitive     CEFTRIAXONE <=0.25 SENSITIVE Sensitive     CIPROFLOXACIN <=  0.25 SENSITIVE Sensitive     GENTAMICIN <=1 SENSITIVE Sensitive     IMIPENEM <=0.25 SENSITIVE Sensitive     TRIMETH/SULFA <=20 SENSITIVE Sensitive     AMPICILLIN/SULBACTAM <=2 SENSITIVE Sensitive     PIP/TAZO <=4 SENSITIVE Sensitive     * ESCHERICHIA COLI  Blood Culture ID Panel (Reflexed)     Status: Abnormal   Collection Time: 03/05/20  5:48 PM  Result Value Ref Range Status   Enterococcus species NOT DETECTED NOT DETECTED Final   Listeria monocytogenes NOT DETECTED NOT DETECTED Final   Staphylococcus species NOT DETECTED NOT DETECTED Final   Staphylococcus aureus (BCID) NOT DETECTED NOT DETECTED Final   Streptococcus species NOT DETECTED NOT DETECTED Final   Streptococcus agalactiae NOT DETECTED NOT DETECTED Final   Streptococcus pneumoniae NOT DETECTED NOT DETECTED Final   Streptococcus pyogenes NOT DETECTED NOT DETECTED Final   Acinetobacter baumannii NOT DETECTED NOT DETECTED Final   Enterobacteriaceae species DETECTED (A) NOT DETECTED Final    Comment: Enterobacteriaceae represent a large family of gram-negative bacteria, not a single organism. CRITICAL RESULT CALLED TO, READ BACK BY AND VERIFIED WITH: G. ABBOTT,PHARMD [id number] 03/06/2020 T. TYSOR    Enterobacter cloacae complex NOT DETECTED NOT DETECTED Final   Escherichia coli DETECTED (A) NOT DETECTED Final    Comment: CRITICAL RESULT CALLED TO, READ BACK BY AND VERIFIED WITH: G. ABBOTT,PHARMD [id number] 03/06/2020 T. TYSOR    Klebsiella oxytoca NOT DETECTED NOT DETECTED Final   Klebsiella pneumoniae NOT DETECTED  NOT DETECTED Final   Proteus species NOT DETECTED NOT DETECTED Final   Serratia marcescens NOT DETECTED NOT DETECTED Final   Carbapenem resistance NOT DETECTED NOT DETECTED Final   Haemophilus influenzae NOT DETECTED NOT DETECTED Final   Neisseria meningitidis NOT DETECTED NOT DETECTED Final   Pseudomonas aeruginosa NOT DETECTED NOT DETECTED Final   Candida albicans NOT DETECTED NOT DETECTED Final   Candida glabrata NOT DETECTED NOT DETECTED Final   Candida krusei NOT DETECTED NOT DETECTED Final   Candida parapsilosis NOT DETECTED NOT DETECTED Final   Candida tropicalis NOT DETECTED NOT DETECTED Final    Comment: Performed at [facility], [place]. [address] [place], [place] [address]  SARS Coronavirus 2 by RT PCR (hospital order, performed in [facility] hospital lab) Nasopharyngeal Nasopharyngeal Swab     Status: None   Collection Time: 03/05/20  5:55 PM   Specimen: Nasopharyngeal Swab  Result Value Ref Range Status   SARS Coronavirus 2 NEGATIVE NEGATIVE Final    Comment: (NOTE) SARS-CoV-2 target nucleic acids are NOT DETECTED.  The SARS-CoV-2 RNA is generally detectable in upper and lower respiratory specimens during the acute phase of infection. The lowest concentration of SARS-CoV-2 viral copies this assay can detect is 250 copies / mL. A negative result does not preclude SARS-CoV-2 infection and should not be used as the sole basis for treatment or other patient management decisions.  A negative result may occur with improper specimen collection / handling, submission of specimen other than nasopharyngeal swab, presence of viral mutation(s) within the areas targeted by this assay, and inadequate number of viral copies (<250 copies / mL). A negative result must be combined with clinical observations, patient history, and epidemiological information.  Fact Sheet for Patients:   StrictlyIdeas.no  Fact Sheet for Healthcare  Providers: BankingDealers.co.za  This test is not yet approved or  cleared by the [place] FDA and has been authorized for detection and/or diagnosis of SARS-CoV-2 by FDA under an Emergency Use Authorization (EUA).  This EUA will remain in effect (meaning this test can be used) for the duration of the COVID-19 declaration under Section 564(b)(1) of the Act, 21 U.S.C. section 360bbb-3(b)(1), unless the authorization is terminated or revoked sooner.  Performed at [facility], [place] [address] [place], [place] [address]   Culture, blood (routine x 2)     Status: Abnormal   Collection Time: 03/05/20  6:00 PM   Specimen: BLOOD LEFT HAND  Result Value Ref Range Status   Specimen Description BLOOD LEFT HAND  Final   Special Requests   Final    BOTTLES DRAWN AEROBIC AND ANAEROBIC Blood Culture adequate volume   Culture  Setup Time   Final    GRAM NEGATIVE RODS ANAEROBIC BOTTLE ONLY CRITICAL VALUE NOTED.  VALUE IS CONSISTENT WITH PREVIOUSLY REPORTED AND CALLED VALUE.    Culture (A)  Final    ESCHERICHIA COLI SUSCEPTIBILITIES PERFORMED ON PREVIOUS CULTURE WITHIN THE LAST 5 DAYS. Performed at [facility], [place] [address] [place], [place] [address]    Report Status 03/08/2020 FINAL  Final  Urine culture     Status: Abnormal   Collection Time: 03/05/20  8:30 PM   Specimen: Urine, Catheterized  Result Value Ref Range Status   Specimen Description URINE, CATHETERIZED  Final   Special Requests   Final    NONE Performed at [facility], [place] [address] [place],  [address]    Culture >=100,000 COLONIES/mL ESCHERICHIA COLI (A)  Final   Report Status 03/08/2020 FINAL  Final   Organism ID, Bacteria ESCHERICHIA COLI (A)  Final      Susceptibility   Escherichia coli - MIC*    AMPICILLIN <=2 SENSITIVE Sensitive     CEFAZOLIN <=4 SENSITIVE Sensitive     CEFTRIAXONE <=0.25 SENSITIVE Sensitive     CIPROFLOXACIN <=0.25 SENSITIVE Sensitive      GENTAMICIN <=1 SENSITIVE Sensitive     IMIPENEM <=0.25 SENSITIVE Sensitive     NITROFURANTOIN <=16 SENSITIVE Sensitive     TRIMETH/SULFA <=20 SENSITIVE Sensitive     AMPICILLIN/SULBACTAM <=2 SENSITIVE Sensitive     PIP/TAZO <=4 SENSITIVE Sensitive     * >=100,000 COLONIES/mL ESCHERICHIA COLI     Time coordinating discharge: Over 30 minutes  SIGNED:   Donnamae Jude, MD  [facility] 03/08/2020, 11:15 AM  If 7PM-7AM, please contact night-coverage

## 2020-03-08 NOTE — Care Management Important Message (Signed)
Important Message  Patient Details  Name: Meagan Schwartz MRN: [id number] Date of Birth: 1946/06/28   Medicare Important Message Given:  Yes     [name] 03/08/2020, 3:32 PM

## 2020-03-08 NOTE — TOC Transition Note (Signed)
Transition of Care [facility]) - CM/SW Discharge Note   Patient Details  Name: Meagan Schwartz MRN: [id number] Date of Birth: 01-18-1946  Transition of Care [facility]) CM/SW Contact:  Carles Collet, RN Phone Number: 03/08/2020, 12:02 PM   Clinical Narrative:   Patient will DC with oral Abx.  [place] and [facility] updated and notified that patient will DC today    Final next level of care: Home w Home Health Services Barriers to Discharge: No Barriers Identified   Patient Goals and CMS Choice Patient states their goals for this hospitalization and ongoing recovery are:: per spouse, would like to return home CMS Medicare.gov Compare Post Acute Care list provided to:: Other (Comment Required) Choice offered to / list presented to : Spouse  Discharge Placement                       Discharge Plan and Services   Discharge Planning Services: CM Consult Post Acute Care Choice: Home Health              Date DME Agency Contacted: 03/08/20 Time DME Agency Contacted: 1202 Representative spoke with at DME Agency: [facility] Glad HH Arranged: RN, PT, Nurse's Aide [facility] Agency: [place] ([place]) Date [place]: 03/07/20 Time [place]: 1137 Representative spoke with at [place]: [place] ([place]) Interventions     Readmission Risk Interventions No flowsheet data found.

## 2020-03-08 NOTE — Progress Notes (Signed)
Discharged home today accompanied by husband. Personal belongings,discharged instructions given. Verbalized understanding of instructions

## 2020-03-09 ENCOUNTER — Telehealth: Payer: Self-pay | Admitting: Internal Medicine

## 2020-03-09 DIAGNOSIS — E039 Hypothyroidism, unspecified: Secondary | ICD-10-CM | POA: Diagnosis not present

## 2020-03-09 DIAGNOSIS — E785 Hyperlipidemia, unspecified: Secondary | ICD-10-CM | POA: Diagnosis not present

## 2020-03-09 DIAGNOSIS — E559 Vitamin D deficiency, unspecified: Secondary | ICD-10-CM | POA: Diagnosis not present

## 2020-03-09 DIAGNOSIS — Z87891 Personal history of nicotine dependence: Secondary | ICD-10-CM | POA: Diagnosis not present

## 2020-03-09 DIAGNOSIS — N390 Urinary tract infection, site not specified: Secondary | ICD-10-CM | POA: Diagnosis not present

## 2020-03-09 DIAGNOSIS — Z9181 History of falling: Secondary | ICD-10-CM | POA: Diagnosis not present

## 2020-03-09 DIAGNOSIS — G301 Alzheimer's disease with late onset: Secondary | ICD-10-CM | POA: Diagnosis not present

## 2020-03-09 DIAGNOSIS — F329 Major depressive disorder, single episode, unspecified: Secondary | ICD-10-CM | POA: Diagnosis not present

## 2020-03-09 DIAGNOSIS — A4151 Sepsis due to Escherichia coli [E. coli]: Secondary | ICD-10-CM | POA: Diagnosis not present

## 2020-03-09 DIAGNOSIS — M1990 Unspecified osteoarthritis, unspecified site: Secondary | ICD-10-CM | POA: Diagnosis not present

## 2020-03-09 DIAGNOSIS — Z993 Dependence on wheelchair: Secondary | ICD-10-CM | POA: Diagnosis not present

## 2020-03-09 DIAGNOSIS — F0280 Dementia in other diseases classified elsewhere without behavioral disturbance: Secondary | ICD-10-CM | POA: Diagnosis not present

## 2020-03-09 NOTE — Telephone Encounter (Signed)
Phone call placed to patient to offer to schedule a visit with [facility]. Phone rang, with no answer I left a voicemail for call back. 

## 2020-03-14 ENCOUNTER — Telehealth: Payer: Self-pay | Admitting: Internal Medicine

## 2020-03-14 DIAGNOSIS — R262 Difficulty in walking, not elsewhere classified: Secondary | ICD-10-CM | POA: Diagnosis not present

## 2020-03-14 DIAGNOSIS — Z9181 History of falling: Secondary | ICD-10-CM | POA: Diagnosis not present

## 2020-03-14 DIAGNOSIS — M6281 Muscle weakness (generalized): Secondary | ICD-10-CM | POA: Diagnosis not present

## 2020-03-14 DIAGNOSIS — R2689 Other abnormalities of gait and mobility: Secondary | ICD-10-CM | POA: Diagnosis not present

## 2020-03-14 NOTE — Telephone Encounter (Signed)
Called patient's home number to schedule a Palliative Consult, no answer - left message with reason for call along with my name and contact number requesting a call back to schedule a visit.

## 2020-03-15 ENCOUNTER — Telehealth: Payer: Self-pay | Admitting: Internal Medicine

## 2020-03-15 ENCOUNTER — Encounter: Payer: Self-pay | Admitting: Adult Health

## 2020-03-15 NOTE — Telephone Encounter (Signed)
Rec'd call back from patient's husband, Annie Main, and after explaining Palliative services to him and all questions anwered he was in agreement with this.  I have scheduled an In-person Consult for 04/04/20 @ 1:30 PM.

## 2020-03-16 ENCOUNTER — Encounter: Payer: Self-pay | Admitting: Adult Health

## 2020-03-16 DIAGNOSIS — F0280 Dementia in other diseases classified elsewhere without behavioral disturbance: Secondary | ICD-10-CM | POA: Diagnosis not present

## 2020-03-16 DIAGNOSIS — N390 Urinary tract infection, site not specified: Secondary | ICD-10-CM | POA: Diagnosis not present

## 2020-03-16 DIAGNOSIS — G301 Alzheimer's disease with late onset: Secondary | ICD-10-CM | POA: Diagnosis not present

## 2020-03-16 MED ORDER — CITALOPRAM HYDROBROMIDE 10 MG PO TABS
20.0000 mg | ORAL_TABLET | Freq: Every day | ORAL | 11 refills | Status: AC
Start: 2020-03-16 — End: ?

## 2020-03-17 ENCOUNTER — Telehealth: Payer: Self-pay

## 2020-03-17 DIAGNOSIS — S79911A Unspecified injury of right hip, initial encounter: Secondary | ICD-10-CM | POA: Diagnosis not present

## 2020-03-17 DIAGNOSIS — W06XXXA Fall from bed, initial encounter: Secondary | ICD-10-CM | POA: Diagnosis not present

## 2020-03-17 NOTE — Telephone Encounter (Signed)
Can you check on this? Thanks

## 2020-03-23 DIAGNOSIS — A4151 Sepsis due to Escherichia coli [E. coli]: Secondary | ICD-10-CM | POA: Diagnosis not present

## 2020-03-23 DIAGNOSIS — Z8619 Personal history of other infectious and parasitic diseases: Secondary | ICD-10-CM | POA: Diagnosis not present

## 2020-03-23 NOTE — Telephone Encounter (Signed)
LVM for Meagan Schwartz And informed him that I called pharmacy they said A pa is needed or rx can be filled w/o ins with an $8 copay  [name] will call back if he rather have PA done.

## 2020-03-25 ENCOUNTER — Other Ambulatory Visit: Payer: Self-pay | Admitting: Neurology

## 2020-03-25 DIAGNOSIS — F0392 Unspecified dementia, unspecified severity, with psychotic disturbance: Secondary | ICD-10-CM

## 2020-03-25 DIAGNOSIS — F0391 Unspecified dementia with behavioral disturbance: Secondary | ICD-10-CM

## 2020-03-25 MED ORDER — QUETIAPINE FUMARATE 25 MG PO TABS: 25.0000 mg | ORAL_TABLET | Freq: Every day | ORAL | 6 refills | Status: DC

## 2020-03-29 NOTE — Telephone Encounter (Signed)
Hey was there another drug that you mentioned to them that she could use during the day?

## 2020-03-30 DIAGNOSIS — Z111 Encounter for screening for respiratory tuberculosis: Secondary | ICD-10-CM | POA: Diagnosis not present

## 2020-04-04 ENCOUNTER — Other Ambulatory Visit: Payer: Self-pay | Admitting: Internal Medicine

## 2020-04-04 MED ORDER — QUETIAPINE FUMARATE 25 MG PO TABS: 25.0000 mg | ORAL_TABLET | Freq: Two times a day (BID) | ORAL | 5 refills | Status: AC

## 2020-04-04 NOTE — Addendum Note (Signed)
Addended by: Trudie Buckler on: 04/04/2020 10:14 AM   Modules accepted: Orders

## 2020-04-05 ENCOUNTER — Other Ambulatory Visit: Payer: Medicare Other | Admitting: Internal Medicine

## 2020-04-05 ENCOUNTER — Other Ambulatory Visit: Payer: Self-pay

## 2020-04-05 DIAGNOSIS — Z7189 Other specified counseling: Secondary | ICD-10-CM

## 2020-04-05 DIAGNOSIS — Z515 Encounter for palliative care: Secondary | ICD-10-CM

## 2020-04-05 NOTE — Progress Notes (Signed)
04/05/2020 [place] Consult Note Telephone: [phone]  Fax: [phone]  PATIENT NAME: Meagan Schwartz DOB: 08-14-1946 MRN: [id number]  [place] [phone]  PRIMARY CARE PROVIDER:   Hulan Fess, MD  REFERRING PROVIDER:  Hulan Fess, MD [place],  [place] [address]  RESPONSIBLE PARTY:  (spouse) Annie Main  [phone] (H). (daughter) [place] [phone] (M)  ASSESSMENT / RECOMMENDATIONS:  1. Advance Care Planning: A. Directives: HCPOA  (spouse DANAYAH SMYRE) and Living Will present in [facility] EMR. We discussed code status. Patient's husband and daughter [place] both concur that they would not want cardiopulmonary resuscitation administered to patient, in the event of a cardiopulmonary arrest. I completed 2 copies of the DNR form, left these in the home, and uploaded into [facility] EMR. We discussed sections of the MOST form. I left them a copy along with some educational material. They will contact me to complete with them when they have had a chance for further review. B. Goals of Care: Family leaning towards comfort level of care with desire for no further hospitalizations. Patient is scheduled to transition to [facility] this Friday, and family is hoping for a successful and smooth transition. Goal for behaviors to be managed as well as possible.  We discussed hospice and its' services, and criteria for admission. Patient would probably need to have demonstrate weight loss to be eligible, though her dementia has shown a rapid progression.  2. Cognitive / Functional status, Symptom Management: FAST 6e/7a. Cognitive decline noted over the last 5 yrs.  Patient is oriented to self only. She no longer recognizes her family members. Dementia with behaviors including resistance to personal care/transfers, shouting out and fighting with her caregivers. She is on Namenda and Aricept. Started Seroquel  25mg  bid about a week ago. Family notes slightly improved behavior today.   She needs much cuing and hands on guidance for mobility. She was introduced to the walker by physical therapy, but patient is having cognitive difficulty with it's use. She shows little spontaneous initiative to movement, and her family were surprised that she managed to slip between the side rails of her bed last night, sustaining a fall (no injuries). She is incontinent of bowel and bladder. She is dependent for hygiene and dressing. She can feed herself.  3. Family / Community Supports: Patient and her spouse reside in their home. They have 24/7 coverage. Their daughter [place] moved back to town to assist in her mom's care. Plan for patient to be admitted to [facility] in 3 days. They would like Palliative Care to continue to follow in the facility. Patient is not able to comprehend this transition  4. Follow up Palliative Care Visit: Will look for Palliative Care referral after patient transferred to [place] this Friday.   I spent 60 minutes providing this consultation from 3pm-4pm. More than 50% of the time in this consultation was spent coordinating communication.   HISTORY OF PRESENT ILLNESS:  Meagan Schwartz is a 74 y.o.  female with medical history significant of advanced Alzheimer's dementia (oriented to self only at baseline), hyperlipidemia, hypothyroidism, depression,  -6/19-6/22/2021: Hospitalized with UTI sepsis. Elevated troponin. Echocardiogram normal  Palliative Care was asked to help address goals of care.   CODE STATUS: DNR  PPS: 30%  HOSPICE ELIGIBILITY/DIAGNOSIS: TBD PAST MEDICAL HISTORY:  Past Medical History:  Diagnosis Date  . Alzheimer disease ([place])   . Arthritis   . Hx of  colonic polyps 12/27/2014  . Hyperlipidemia   . 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 Vitamin D deficiency     SOCIAL HX:  Social History   Tobacco Use  . Smoking status: Former Research scientist (life sciences)  .  Smokeless tobacco: Never Used  . Tobacco comment: smoked while in college  Substance Use Topics  . Alcohol use: Not Currently    ALLERGIES:  Allergies  Allergen Reactions  . Clindamycin Other (See Comments)    Unknown reaction     PERTINENT MEDICATIONS:  Outpatient Encounter Medications as of 04/05/2020  Medication Sig  . citalopram (CELEXA) 10 MG tablet Take 2 tablets (20 mg total) by mouth daily.  Marland Kitchen donepezil (ARICEPT) 10 MG tablet TAKE 1 TABLET BY MOUTH AT BEDTIME (Patient taking differently: Take 10 mg by mouth at bedtime. )  . levothyroxine (SYNTHROID, LEVOTHROID) 75 MCG tablet Take 75 mcg by mouth daily before breakfast.   . Melatonin 5 MG TABS Take 10 mg by mouth at bedtime.   . memantine (NAMENDA) 10 MG tablet Take 1 tablet (10 mg total) by mouth 2 (two) times daily.  . Multiple Vitamins-Minerals (OCUVITE ADULT 50+) CAPS Take 1 capsule by mouth daily with lunch.  Marland Kitchen OVER THE COUNTER MEDICATION Take by mouth See admin instructions. Life[employer] Recode Alzheimer's regimen of probotics, vitamins, omega 3, MCT oil, etc - take 8 capsules by mouth with each meal, Mix 2 scoops of morning balance powder in almond milk and drink every morning, mix 1 scoop of evening balance in water and drink every evening.  Marland Kitchen QUEtiapine (SEROQUEL) 25 MG tablet Take 1 tablet (25 mg total) by mouth 2 (two) times daily.   No facility-administered encounter medications on file as of 04/05/2020.    PHYSICAL EXAM:   Well nourished elderly women sitting in recliner. Caregiver, spouse, and daughter in attendance.  She is alert and pleasant. Oriented to self only. Not able to identify family members.  Extremities: no edema, no joint deformities Skin: no rashes Neurological: Weakness but otherwise non-focal  Meagan Handler, NP

## 2020-04-06 ENCOUNTER — Encounter: Payer: Self-pay | Admitting: Internal Medicine

## 2020-04-07 ENCOUNTER — Other Ambulatory Visit: Payer: Medicare Other | Admitting: Internal Medicine

## 2020-04-07 ENCOUNTER — Other Ambulatory Visit: Payer: Self-pay

## 2020-04-07 ENCOUNTER — Encounter: Payer: Self-pay | Admitting: Internal Medicine

## 2020-04-07 DIAGNOSIS — Z7189 Other specified counseling: Secondary | ICD-10-CM

## 2020-04-07 DIAGNOSIS — Z515 Encounter for palliative care: Secondary | ICD-10-CM

## 2020-04-07 NOTE — Progress Notes (Signed)
04/07/2020 [facility] Consult Note Telephone: [phone]  Fax: [phone]  PATIENT NAME: Meagan Schwartz DOB: October 04, 1945 MRN: [id number]  [place] [phone]  PRIMARY CARE PROVIDER:   Hulan Fess, MD  REFERRING PROVIDER:  Hulan Fess, MD [place],   [address]  RESPONSIBLE PARTY:  (spouse) Meagan Schwartz  [phone] (H). (daughter) Meagan Schwartz [phone] (M)  ASSESSMENT / RECOMMENDATIONS:  1. Advance Care Planning: A. Directives: Visit to complete MOST form. Spouse and his family have reviewed, shared their opinions, and made final decisions regarding the MOST form. Details: DNR/DNI. Comfort level of care. Antibiotics and IVFs: determine at the time of need/defined trial period. No to tube feedings. I reviewed again with spouse, completed the information, and uploaded into [facility] E[facility]. Spouse plans to give original to [place] to place in facility chart.  DNR, HCPOA  (spouse Meagan Schwartz) and Living Will present in [facility] E[facility].   B. Goals of Care: Comfort level of care with desire for no further hospitalizations. Patient is scheduled to transition to [facility] tomorrow, and family is hoping for a successful and smooth transition. Goal for behaviors to be managed as well as possible.   2. Cognitive / Functional status, Symptom Management (reviewed and updated from previous note): FAST 6e/7a. Cognitive decline noted over the last 5 yrs.  Patient is oriented to self only. She no longer recognizes her family members. Dementia with behaviors including resistance to personal care/transfers, shouting out and fighting with her caregivers. She is on Namenda and Aricept. Started Seroquel 25mg  bid about a week ago. Family notes stable though suboptimally controled behavior. Awaiting period of transition before adjusting psych medication.  She needs much cuing and hands on guidance for mobility.  She was introduced to the walker by physical therapy, but patient is having cognitive difficulty with it's use. She shows little spontaneous initiative to movement, and her family were surprised that she managed to slip between the side rails of her bed last night, sustaining a fall (no injuries). She is incontinent of bowel and bladder. She is dependent for hygiene and dressing. She can feed herself.  3. Family / Community Supports:  Plans for patient to be admitted to [facility] tomorrow. They would like Palliative Care to continue to follow in the facility. I alerted our referral team to reach out or look for that referral once patient moved in.  4. Follow up Palliative Care Visit: Pnd new referral.  I spent 30 minutes providing this consultation from 11am to 11:30pm. More than 50% of the time in this consultation was spent coordinating communication.   HISTORY OF PRESENT ILLNESS:  Meagan Schwartz is a 74 y.o.  female with medical history significant ofadvanced Alzheimer's dementia (oriented to self only at baseline), hyperlipidemia, hypothyroidism, depression,  -6/19-6/22/2021: Hospitalized with UTI sepsis. Elevated troponin. Echocardiogram normal  Palliative Care was asked to help address goals of care.   CODE STATUS: DNR  PPS: 30%  HOSPICE ELIGIBILITY/DIAGNOSIS: TBD  PAST MEDICAL HISTORY:  Past Medical History:  Diagnosis Date  . Alzheimer disease ([place])   . Arthritis   . Hx of colonic polyps 12/27/2014  . Hyperlipidemia   . 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 Vitamin D deficiency     SOCIAL HX:  Social History   Tobacco Use  . Smoking status: Former Research scientist (life sciences)  . Smokeless tobacco: Never Used  . Tobacco comment: smoked while in  college  Substance Use Topics  . Alcohol use: Not Currently    ALLERGIES:  Allergies  Allergen Reactions  . Clindamycin Other (See Comments)    Unknown reaction     PERTINENT MEDICATIONS:  Outpatient Encounter  Medications as of 04/07/2020  Medication Sig  . citalopram (CELEXA) 10 MG tablet Take 2 tablets (20 mg total) by mouth daily.  Marland Kitchen donepezil (ARICEPT) 10 MG tablet TAKE 1 TABLET BY MOUTH AT BEDTIME (Patient taking differently: Take 10 mg by mouth at bedtime. )  . levothyroxine (SYNTHROID, LEVOTHROID) 75 MCG tablet Take 75 mcg by mouth daily before breakfast.   . Melatonin 5 MG TABS Take 10 mg by mouth at bedtime.   . memantine (NAMENDA) 10 MG tablet Take 1 tablet (10 mg total) by mouth 2 (two) times daily.  . Multiple Vitamins-Minerals (OCUVITE ADULT 50+) CAPS Take 1 capsule by mouth daily with lunch.  Marland Kitchen OVER THE COUNTER MEDICATION Take by mouth See admin instructions. [employer] Recode Alzheimer's regimen of probotics, vitamins, omega 3, MCT oil, etc - take 8 capsules by mouth with each meal, Mix 2 scoops of morning balance powder in almond milk and drink every morning, mix 1 scoop of evening balance in water and drink every evening.  Marland Kitchen QUEtiapine (SEROQUEL) 25 MG tablet Take 1 tablet (25 mg total) by mouth 2 (two) times daily.   No facility-administered encounter medications on file as of 04/07/2020.    Julianne Handler, NP  [place]

## 2020-04-08 DIAGNOSIS — M1990 Unspecified osteoarthritis, unspecified site: Secondary | ICD-10-CM | POA: Diagnosis not present

## 2020-04-08 DIAGNOSIS — E039 Hypothyroidism, unspecified: Secondary | ICD-10-CM | POA: Diagnosis not present

## 2020-04-08 DIAGNOSIS — N390 Urinary tract infection, site not specified: Secondary | ICD-10-CM | POA: Diagnosis not present

## 2020-04-08 DIAGNOSIS — F329 Major depressive disorder, single episode, unspecified: Secondary | ICD-10-CM | POA: Diagnosis not present

## 2020-04-08 DIAGNOSIS — F0280 Dementia in other diseases classified elsewhere without behavioral disturbance: Secondary | ICD-10-CM | POA: Diagnosis not present

## 2020-04-08 DIAGNOSIS — A4151 Sepsis due to Escherichia coli [E. coli]: Secondary | ICD-10-CM | POA: Diagnosis not present

## 2020-04-08 DIAGNOSIS — E559 Vitamin D deficiency, unspecified: Secondary | ICD-10-CM | POA: Diagnosis not present

## 2020-04-08 DIAGNOSIS — G301 Alzheimer's disease with late onset: Secondary | ICD-10-CM | POA: Diagnosis not present

## 2020-04-08 DIAGNOSIS — E785 Hyperlipidemia, unspecified: Secondary | ICD-10-CM | POA: Diagnosis not present

## 2020-04-08 DIAGNOSIS — Z993 Dependence on wheelchair: Secondary | ICD-10-CM | POA: Diagnosis not present

## 2020-04-08 DIAGNOSIS — Z9181 History of falling: Secondary | ICD-10-CM | POA: Diagnosis not present

## 2020-04-08 DIAGNOSIS — Z87891 Personal history of nicotine dependence: Secondary | ICD-10-CM | POA: Diagnosis not present

## 2020-04-11 DIAGNOSIS — G309 Alzheimer's disease, unspecified: Secondary | ICD-10-CM | POA: Diagnosis not present

## 2020-04-11 DIAGNOSIS — F0281 Dementia in other diseases classified elsewhere with behavioral disturbance: Secondary | ICD-10-CM | POA: Diagnosis not present

## 2020-04-11 DIAGNOSIS — F0391 Unspecified dementia with behavioral disturbance: Secondary | ICD-10-CM | POA: Diagnosis not present

## 2020-04-11 DIAGNOSIS — F331 Major depressive disorder, recurrent, moderate: Secondary | ICD-10-CM | POA: Diagnosis not present

## 2020-04-12 ENCOUNTER — Other Ambulatory Visit: Payer: Self-pay

## 2020-04-12 ENCOUNTER — Non-Acute Institutional Stay: Payer: Medicare Other | Admitting: Hospice

## 2020-04-12 DIAGNOSIS — F0391 Unspecified dementia with behavioral disturbance: Secondary | ICD-10-CM

## 2020-04-12 DIAGNOSIS — Z515 Encounter for palliative care: Secondary | ICD-10-CM

## 2020-04-12 NOTE — Progress Notes (Signed)
[facility] Consult Note Telephone: [phone]  Fax: [phone]  PATIENT NAME: Meagan Schwartz DOB:1946-04-28 MVE:[id number]  [place] [phone]  PRIMARY CARE PROVIDER:Little, Lennette Bihari, MD  REFERRING PROVIDER:Little, Lennette Bihari, [place], [place] [address]  RESPONSIBLE PARTY:(spouse)[phone] (H).(daughter)Meagan Schwartz [phone])  ASSESSMENT / RECOMMENDATIONS: 1.Advance Care Plan: Visit is to build trust and follow-up on palliative.  Patient is a DO NOT RESUSCITATE/DO NOT INTUBATE.  MOST selections include comfort care,  Antibiotics and IVFs: determine at the time of need/defined trial period. No to tube feedings.  Both DNR and MOST forms in facility records and also uploaded in epi. Goals of Care: Comfort level of care with desire for no further hospitalizations.  NP called spouse and left a voicemail with callback number.  Palliative care will continue to provide support to patient, family and the medical team. 2.Follow-up: palliative Care to continue to follow in the facility.  Next follow-up in 6 weeks             3. Symptom Management: Cognitive / Functional status: Ongoing memory loss/confusion related to Dementia.  FAST 7a, limited language, incontinent of bowel and bladder. Cognitive decline noted over the last 5 yrs. Patient is oriented to self only. She no longer recognizes her family members, per last NP report. Dementia with behaviors including resistance to personal care/transfers, shouting out and fighting with her caregivers. She is on Namenda and Aricept. She was started on Seroquel 25mg  bid about a week ago; readjusted yesterday by PCP to Seroquel 50mg  in the morning and 25mg  at night.  This adjustments was discussed with nursing staff and she verbalized understanding of the new dosage.  Patient refused hands-on assessment during visit.  Nursing to monitor behavior of  agitation and report if no improvement. Chart review indicates patient needing cuing and hands on guidance for mobility; she was introduced to the walker by physical therapy, but  having cognitive difficulty with it's use.  She is a high fall risk; fall risk precautions in place.  She feeds self after set up.  Patient denies pain/discomfort, FLACC 0; nursing staff with no concerns.  Encouraged ongoing nursing care. Patient recently moved from home to [place]-04/08/2020. I spent  1 hour and 16 minutes providing this initial consultation.  Time includes time with patient/family, chart review and documentation.  More than 50% of the time in this consultation was spent coordinating communication.   HISTORY OF PRESENT ILLNESS:Meagan A Mischenis a 74 y.o. femalewith medical history significant ofadvanced Alzheimer's dementia (oriented to self only at baseline), hyperlipidemia, hypothyroidism, depression. 6/19-6/22/2021: Hospitalized with UTI sepsis- resolved.  Palliative Care was asked to help address goals of care.   CODE STATUS: DNR  PPS:40%  HOSPICE ELIGIBILITY/DIAGNOSIS: TBD  PAST MEDICAL HISTORY:  Past Medical History:  Diagnosis Date  . Alzheimer disease ([place])   . Arthritis   . Hx of colonic polyps 12/27/2014  . Hyperlipidemia   . 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 Vitamin D deficiency     SOCIAL HX:  Social History   Tobacco Use  . Smoking status: Former Research scientist (life sciences)  . Smokeless tobacco: Never Used  . Tobacco comment: smoked while in college  Substance Use Topics  . Alcohol use: Not Currently    ALLERGIES:  Allergies  Allergen Reactions  . Clindamycin Other (See Comments)    Unknown reaction     PERTINENT MEDICATIONS:  Outpatient Encounter Medications as of 04/12/2020  Medication Sig  .  citalopram (CELEXA) 10 MG tablet Take 2 tablets (20 mg total) by mouth daily.  Marland Kitchen donepezil (ARICEPT) 10 MG tablet TAKE 1 TABLET BY MOUTH AT BEDTIME (Patient taking  differently: Take 10 mg by mouth at bedtime. )  . levothyroxine (SYNTHROID, LEVOTHROID) 75 MCG tablet Take 75 mcg by mouth daily before breakfast.   . Melatonin 5 MG TABS Take 10 mg by mouth at bedtime.   . memantine (NAMENDA) 10 MG tablet Take 1 tablet (10 mg total) by mouth 2 (two) times daily.  . Multiple Vitamins-Minerals (OCUVITE ADULT 50+) CAPS Take 1 capsule by mouth daily with lunch.  Marland Kitchen OVER THE COUNTER MEDICATION Take by mouth See admin instructions. Life[employer] Recode Alzheimer's regimen of probotics, vitamins, omega 3, MCT oil, etc - take 8 capsules by mouth with each meal, Mix 2 scoops of morning balance powder in almond milk and drink every morning, mix 1 scoop of evening balance in water and drink every evening.  Marland Kitchen QUEtiapine (SEROQUEL) 25 MG tablet Take 1 tablet (25 mg total) by mouth 2 (two) times daily.   No facility-administered encounter medications on file as of 04/12/2020.    PHYSICAL EXAM/ROS:  General: NAD, sitting quietly in the common area Cardiovascular: Denies chest pain Pulmonary: No coughing, normal respiratory effort Extremities: no edema Skin: no rashes to exposed skin Neurological: Weakness but otherwise nonfocal  Teodoro Spray, NP

## 2020-04-22 DIAGNOSIS — R6883 Chills (without fever): Secondary | ICD-10-CM | POA: Diagnosis not present

## 2020-04-22 DIAGNOSIS — R400 Somnolence: Secondary | ICD-10-CM | POA: Diagnosis not present

## 2020-04-22 DIAGNOSIS — R509 Fever, unspecified: Secondary | ICD-10-CM | POA: Diagnosis not present

## 2020-04-27 DIAGNOSIS — F29 Unspecified psychosis not due to a substance or known physiological condition: Secondary | ICD-10-CM | POA: Diagnosis not present

## 2020-04-27 DIAGNOSIS — G309 Alzheimer's disease, unspecified: Secondary | ICD-10-CM | POA: Diagnosis not present

## 2020-04-27 DIAGNOSIS — F339 Major depressive disorder, recurrent, unspecified: Secondary | ICD-10-CM | POA: Diagnosis not present

## 2020-04-27 DIAGNOSIS — F0280 Dementia in other diseases classified elsewhere without behavioral disturbance: Secondary | ICD-10-CM | POA: Diagnosis not present

## 2020-05-02 DIAGNOSIS — G309 Alzheimer's disease, unspecified: Secondary | ICD-10-CM | POA: Diagnosis not present

## 2020-05-02 DIAGNOSIS — R600 Localized edema: Secondary | ICD-10-CM | POA: Diagnosis not present

## 2020-05-02 DIAGNOSIS — F0281 Dementia in other diseases classified elsewhere with behavioral disturbance: Secondary | ICD-10-CM | POA: Diagnosis not present

## 2020-05-02 DIAGNOSIS — F331 Major depressive disorder, recurrent, moderate: Secondary | ICD-10-CM | POA: Diagnosis not present

## 2020-05-04 DIAGNOSIS — R262 Difficulty in walking, not elsewhere classified: Secondary | ICD-10-CM | POA: Diagnosis not present

## 2020-05-04 DIAGNOSIS — M6281 Muscle weakness (generalized): Secondary | ICD-10-CM | POA: Diagnosis not present

## 2020-05-04 DIAGNOSIS — R600 Localized edema: Secondary | ICD-10-CM | POA: Diagnosis not present

## 2020-05-09 DIAGNOSIS — Z0120 Encounter for dental examination and cleaning without abnormal findings: Secondary | ICD-10-CM | POA: Diagnosis not present

## 2020-05-10 DIAGNOSIS — R262 Difficulty in walking, not elsewhere classified: Secondary | ICD-10-CM | POA: Diagnosis not present

## 2020-05-10 DIAGNOSIS — M6281 Muscle weakness (generalized): Secondary | ICD-10-CM | POA: Diagnosis not present

## 2020-05-10 DIAGNOSIS — R600 Localized edema: Secondary | ICD-10-CM | POA: Diagnosis not present

## 2020-05-12 DIAGNOSIS — M6281 Muscle weakness (generalized): Secondary | ICD-10-CM | POA: Diagnosis not present

## 2020-05-12 DIAGNOSIS — R600 Localized edema: Secondary | ICD-10-CM | POA: Diagnosis not present

## 2020-05-12 DIAGNOSIS — R262 Difficulty in walking, not elsewhere classified: Secondary | ICD-10-CM | POA: Diagnosis not present

## 2020-05-18 DIAGNOSIS — M6281 Muscle weakness (generalized): Secondary | ICD-10-CM | POA: Diagnosis not present

## 2020-05-18 DIAGNOSIS — R278 Other lack of coordination: Secondary | ICD-10-CM | POA: Diagnosis not present

## 2020-05-18 DIAGNOSIS — R488 Other symbolic dysfunctions: Secondary | ICD-10-CM | POA: Diagnosis not present

## 2020-05-18 DIAGNOSIS — R262 Difficulty in walking, not elsewhere classified: Secondary | ICD-10-CM | POA: Diagnosis not present

## 2020-05-20 DIAGNOSIS — R488 Other symbolic dysfunctions: Secondary | ICD-10-CM | POA: Diagnosis not present

## 2020-05-20 DIAGNOSIS — R278 Other lack of coordination: Secondary | ICD-10-CM | POA: Diagnosis not present

## 2020-05-20 DIAGNOSIS — M6281 Muscle weakness (generalized): Secondary | ICD-10-CM | POA: Diagnosis not present

## 2020-05-20 DIAGNOSIS — R262 Difficulty in walking, not elsewhere classified: Secondary | ICD-10-CM | POA: Diagnosis not present

## 2020-05-23 DIAGNOSIS — R262 Difficulty in walking, not elsewhere classified: Secondary | ICD-10-CM | POA: Diagnosis not present

## 2020-05-23 DIAGNOSIS — E039 Hypothyroidism, unspecified: Secondary | ICD-10-CM | POA: Diagnosis not present

## 2020-05-23 DIAGNOSIS — G309 Alzheimer's disease, unspecified: Secondary | ICD-10-CM | POA: Diagnosis not present

## 2020-05-23 DIAGNOSIS — R278 Other lack of coordination: Secondary | ICD-10-CM | POA: Diagnosis not present

## 2020-05-23 DIAGNOSIS — M6281 Muscle weakness (generalized): Secondary | ICD-10-CM | POA: Diagnosis not present

## 2020-05-23 DIAGNOSIS — R488 Other symbolic dysfunctions: Secondary | ICD-10-CM | POA: Diagnosis not present

## 2020-05-23 DIAGNOSIS — D7589 Other specified diseases of blood and blood-forming organs: Secondary | ICD-10-CM | POA: Diagnosis not present

## 2020-05-23 DIAGNOSIS — E7849 Other hyperlipidemia: Secondary | ICD-10-CM | POA: Diagnosis not present

## 2020-05-24 DIAGNOSIS — F0281 Dementia in other diseases classified elsewhere with behavioral disturbance: Secondary | ICD-10-CM | POA: Diagnosis not present

## 2020-05-24 DIAGNOSIS — E7849 Other hyperlipidemia: Secondary | ICD-10-CM | POA: Diagnosis not present

## 2020-05-25 DIAGNOSIS — M6281 Muscle weakness (generalized): Secondary | ICD-10-CM | POA: Diagnosis not present

## 2020-05-25 DIAGNOSIS — R278 Other lack of coordination: Secondary | ICD-10-CM | POA: Diagnosis not present

## 2020-05-25 DIAGNOSIS — R488 Other symbolic dysfunctions: Secondary | ICD-10-CM | POA: Diagnosis not present

## 2020-05-25 DIAGNOSIS — R262 Difficulty in walking, not elsewhere classified: Secondary | ICD-10-CM | POA: Diagnosis not present

## 2020-05-26 DIAGNOSIS — R488 Other symbolic dysfunctions: Secondary | ICD-10-CM | POA: Diagnosis not present

## 2020-05-26 DIAGNOSIS — R262 Difficulty in walking, not elsewhere classified: Secondary | ICD-10-CM | POA: Diagnosis not present

## 2020-05-26 DIAGNOSIS — R278 Other lack of coordination: Secondary | ICD-10-CM | POA: Diagnosis not present

## 2020-05-26 DIAGNOSIS — M6281 Muscle weakness (generalized): Secondary | ICD-10-CM | POA: Diagnosis not present

## 2020-05-30 DIAGNOSIS — R278 Other lack of coordination: Secondary | ICD-10-CM | POA: Diagnosis not present

## 2020-05-30 DIAGNOSIS — M6281 Muscle weakness (generalized): Secondary | ICD-10-CM | POA: Diagnosis not present

## 2020-05-30 DIAGNOSIS — R262 Difficulty in walking, not elsewhere classified: Secondary | ICD-10-CM | POA: Diagnosis not present

## 2020-05-30 DIAGNOSIS — R488 Other symbolic dysfunctions: Secondary | ICD-10-CM | POA: Diagnosis not present

## 2020-06-02 DIAGNOSIS — M6281 Muscle weakness (generalized): Secondary | ICD-10-CM | POA: Diagnosis not present

## 2020-06-02 DIAGNOSIS — R278 Other lack of coordination: Secondary | ICD-10-CM | POA: Diagnosis not present

## 2020-06-02 DIAGNOSIS — R262 Difficulty in walking, not elsewhere classified: Secondary | ICD-10-CM | POA: Diagnosis not present

## 2020-06-02 DIAGNOSIS — R488 Other symbolic dysfunctions: Secondary | ICD-10-CM | POA: Diagnosis not present

## 2020-06-06 DIAGNOSIS — M6281 Muscle weakness (generalized): Secondary | ICD-10-CM | POA: Diagnosis not present

## 2020-06-06 DIAGNOSIS — R278 Other lack of coordination: Secondary | ICD-10-CM | POA: Diagnosis not present

## 2020-06-06 DIAGNOSIS — R262 Difficulty in walking, not elsewhere classified: Secondary | ICD-10-CM | POA: Diagnosis not present

## 2020-06-06 DIAGNOSIS — R488 Other symbolic dysfunctions: Secondary | ICD-10-CM | POA: Diagnosis not present

## 2020-06-08 DIAGNOSIS — R488 Other symbolic dysfunctions: Secondary | ICD-10-CM | POA: Diagnosis not present

## 2020-06-08 DIAGNOSIS — R262 Difficulty in walking, not elsewhere classified: Secondary | ICD-10-CM | POA: Diagnosis not present

## 2020-06-08 DIAGNOSIS — R278 Other lack of coordination: Secondary | ICD-10-CM | POA: Diagnosis not present

## 2020-06-08 DIAGNOSIS — M6281 Muscle weakness (generalized): Secondary | ICD-10-CM | POA: Diagnosis not present

## 2020-06-10 DIAGNOSIS — M6281 Muscle weakness (generalized): Secondary | ICD-10-CM | POA: Diagnosis not present

## 2020-06-10 DIAGNOSIS — R488 Other symbolic dysfunctions: Secondary | ICD-10-CM | POA: Diagnosis not present

## 2020-06-10 DIAGNOSIS — R278 Other lack of coordination: Secondary | ICD-10-CM | POA: Diagnosis not present

## 2020-06-10 DIAGNOSIS — R262 Difficulty in walking, not elsewhere classified: Secondary | ICD-10-CM | POA: Diagnosis not present

## 2020-06-13 DIAGNOSIS — R278 Other lack of coordination: Secondary | ICD-10-CM | POA: Diagnosis not present

## 2020-06-13 DIAGNOSIS — M6281 Muscle weakness (generalized): Secondary | ICD-10-CM | POA: Diagnosis not present

## 2020-06-13 DIAGNOSIS — R488 Other symbolic dysfunctions: Secondary | ICD-10-CM | POA: Diagnosis not present

## 2020-06-13 DIAGNOSIS — R262 Difficulty in walking, not elsewhere classified: Secondary | ICD-10-CM | POA: Diagnosis not present

## 2020-06-15 DIAGNOSIS — R488 Other symbolic dysfunctions: Secondary | ICD-10-CM | POA: Diagnosis not present

## 2020-06-15 DIAGNOSIS — R262 Difficulty in walking, not elsewhere classified: Secondary | ICD-10-CM | POA: Diagnosis not present

## 2020-06-15 DIAGNOSIS — R278 Other lack of coordination: Secondary | ICD-10-CM | POA: Diagnosis not present

## 2020-06-15 DIAGNOSIS — M6281 Muscle weakness (generalized): Secondary | ICD-10-CM | POA: Diagnosis not present

## 2020-06-16 DIAGNOSIS — R488 Other symbolic dysfunctions: Secondary | ICD-10-CM | POA: Diagnosis not present

## 2020-06-16 DIAGNOSIS — R278 Other lack of coordination: Secondary | ICD-10-CM | POA: Diagnosis not present

## 2020-06-16 DIAGNOSIS — M6281 Muscle weakness (generalized): Secondary | ICD-10-CM | POA: Diagnosis not present

## 2020-06-16 DIAGNOSIS — R262 Difficulty in walking, not elsewhere classified: Secondary | ICD-10-CM | POA: Diagnosis not present

## 2020-06-17 DIAGNOSIS — R278 Other lack of coordination: Secondary | ICD-10-CM | POA: Diagnosis not present

## 2020-06-17 DIAGNOSIS — R262 Difficulty in walking, not elsewhere classified: Secondary | ICD-10-CM | POA: Diagnosis not present

## 2020-06-17 DIAGNOSIS — M6281 Muscle weakness (generalized): Secondary | ICD-10-CM | POA: Diagnosis not present

## 2020-06-17 DIAGNOSIS — R488 Other symbolic dysfunctions: Secondary | ICD-10-CM | POA: Diagnosis not present

## 2020-06-20 DIAGNOSIS — R278 Other lack of coordination: Secondary | ICD-10-CM | POA: Diagnosis not present

## 2020-06-20 DIAGNOSIS — R488 Other symbolic dysfunctions: Secondary | ICD-10-CM | POA: Diagnosis not present

## 2020-06-20 DIAGNOSIS — R262 Difficulty in walking, not elsewhere classified: Secondary | ICD-10-CM | POA: Diagnosis not present

## 2020-06-20 DIAGNOSIS — M6281 Muscle weakness (generalized): Secondary | ICD-10-CM | POA: Diagnosis not present

## 2020-06-21 ENCOUNTER — Non-Acute Institutional Stay: Payer: Medicare Other | Admitting: Hospice

## 2020-06-21 ENCOUNTER — Other Ambulatory Visit: Payer: Self-pay

## 2020-06-21 DIAGNOSIS — Z515 Encounter for palliative care: Secondary | ICD-10-CM | POA: Diagnosis not present

## 2020-06-21 DIAGNOSIS — G309 Alzheimer's disease, unspecified: Secondary | ICD-10-CM | POA: Diagnosis not present

## 2020-06-21 DIAGNOSIS — R600 Localized edema: Secondary | ICD-10-CM | POA: Diagnosis not present

## 2020-06-21 DIAGNOSIS — M6281 Muscle weakness (generalized): Secondary | ICD-10-CM | POA: Diagnosis not present

## 2020-06-21 DIAGNOSIS — F0391 Unspecified dementia with behavioral disturbance: Secondary | ICD-10-CM

## 2020-06-21 NOTE — Progress Notes (Addendum)
Meagan Schwartz Consult Note Telephone: [phone]  Fax: [phone]  PATIENT NAME: Meagan Schwartz DOB: 1945/12/28 MRN: [id number]  PRIMARY CARE PROVIDER:   Hulan Fess, MD Hulan Fess, MD [place],  [place] [address]  REFERRING PROVIDER: Hulan Fess, MD Hulan Fess, MD [place],  [place] [address]   RESPONSIBLE PARTY:(spouse)[phone] (H).(daughter)[place] [phone])  ASSESSMENT / RECOMMENDATIONS  Advance Care Plan:  Visit consisted of building trust and follow up on Palliative Medicine as specialized medical care for people living with serious illness, aimed at facilitating better quality of life through symptoms relief, assisting with advance care plan and establishing goals of care.  CODE STATUS: Patient is a DO NOT RESUSCITATE/DO NOT INTUBATE.   Goals of care: MOST selections include comfort care,  Antibiotics and IVFs: determine at the time of need/defined trial period. No to tube feedings.  Both DNR and MOST forms in facility records and also uploaded in epi. Goals of Care:Comfort level of care with desire for no further hospitalizations.  Palliative care will continue to provide support to patient, family and the medical team.              Follow-up: palliative Care to continue to follow in the facility.  Next follow-up in 2 months              Symptom Management: Cognitive / Functional status:  Patient continues in incremental cognitive/functional decline in line with dementia disease trajectory.  Ongoing memory loss/confusion related to Dementia.  FAST 7a, limited language, incontinent of bowel and bladder. Cognitive decline noted over the last 5 yrs. Patient is oriented to self only. She no longer recognizes her family members, per last NP report.  She continues on behavior such as resistance to personal care/transfers, shouting out and fighting with her caregivers.   Encouraged redirection.  She is on Namenda and Aricep, Seroquel. She feeds self after set up.   Patient denies pain/discomfort, in no medical acuity, FLACC 0; nursing staff with no concerns.  Encouraged ongoing nursing care. Palliative will continue to monitor for symptom management/decline and make recommendations as needed.  Caregiver/family/community supports: Patient recently moved from home to [place]-04/08/2020 to ensure patient's care and safety.  I spent50 minutes providing this consultation.  Time includes time with patient/family, chart review and documentation.  More than 50% of the time in this consultation was spent coordinating communication.   HISTORY OF PRESENT ILLNESS:Meagan Schwartz a 74 y.o. femalewith medical history significant ofadvanced Alzheimer's dementia (oriented to self only at baseline), hyperlipidemia, hypothyroidism, depression. Palliative Care was asked to help address goals of care.   CODE STATUS: DNR  PPS:40%  HOSPICE ELIGIBILITY/DIAGNOSIS: TBD  PAST MEDICAL HISTORY:  Past Medical History:  Diagnosis Date  . Alzheimer disease ([place])   . Arthritis   . Hx of colonic polyps 12/27/2014  . Hyperlipidemia   . 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 Vitamin D deficiency     SOCIAL HX:  Social History   Tobacco Use  . Smoking status: Former Research scientist (life sciences)  . Smokeless tobacco: Never Used  . Tobacco comment: smoked while in college  Substance Use Topics  . Alcohol use: Not Currently    ALLERGIES:  Allergies  Allergen Reactions  . Clindamycin Other (See Comments)    Unknown reaction     PERTINENT MEDICATIONS:  Outpatient Encounter Medications as of 06/21/2020  Medication Sig  . citalopram (CELEXA) 10 MG tablet Take 2  tablets (20 mg total) by mouth daily.  Marland Kitchen donepezil (ARICEPT) 10 MG tablet TAKE 1 TABLET BY MOUTH AT BEDTIME (Patient taking differently: Take 10 mg by mouth at bedtime. )  . levothyroxine (SYNTHROID, LEVOTHROID) 75 MCG  tablet Take 75 mcg by mouth daily before breakfast.   . Melatonin 5 MG TABS Take 10 mg by mouth at bedtime.   . memantine (NAMENDA) 10 MG tablet Take 1 tablet (10 mg total) by mouth 2 (two) times daily.  . Multiple Vitamins-Minerals (OCUVITE ADULT 50+) CAPS Take 1 capsule by mouth daily with lunch.  Marland Kitchen OVER THE COUNTER MEDICATION Take by mouth See admin instructions. LifeSeasons Recode Alzheimer's regimen of probotics, vitamins, omega 3, MCT oil, etc - take 8 capsules by mouth with each meal, Mix 2 scoops of morning balance powder in almond milk and drink every morning, mix 1 scoop of evening balance in water and drink every evening.  Marland Kitchen QUEtiapine (SEROQUEL) 25 MG tablet Take 1 tablet (25 mg total) by mouth 2 (two) times daily.   No facility-administered encounter medications on file as of 06/21/2020.    PHYSICAL EXAM/ROS:  General: NAD, cooperative Cardiovascular: regular rate and rhythm; denies chest pain Pulmonary: clear ant/post fields; no adventitious sounds auscultated, normal respiratory efforts on room air Abdomen: soft, nontender, + bowel sounds GU: no suprapubic tenderness Extremities: no edema, no joint deformities Skin: no rashes noted to exposed skin Neurological: Weakness but otherwise nonfocal, confused, forgetful  Teodoro Spray, NP

## 2020-06-22 DIAGNOSIS — R488 Other symbolic dysfunctions: Secondary | ICD-10-CM | POA: Diagnosis not present

## 2020-06-22 DIAGNOSIS — R262 Difficulty in walking, not elsewhere classified: Secondary | ICD-10-CM | POA: Diagnosis not present

## 2020-06-22 DIAGNOSIS — R278 Other lack of coordination: Secondary | ICD-10-CM | POA: Diagnosis not present

## 2020-06-22 DIAGNOSIS — M6281 Muscle weakness (generalized): Secondary | ICD-10-CM | POA: Diagnosis not present

## 2020-06-27 DIAGNOSIS — R262 Difficulty in walking, not elsewhere classified: Secondary | ICD-10-CM | POA: Diagnosis not present

## 2020-06-27 DIAGNOSIS — R278 Other lack of coordination: Secondary | ICD-10-CM | POA: Diagnosis not present

## 2020-06-27 DIAGNOSIS — M6281 Muscle weakness (generalized): Secondary | ICD-10-CM | POA: Diagnosis not present

## 2020-06-27 DIAGNOSIS — R488 Other symbolic dysfunctions: Secondary | ICD-10-CM | POA: Diagnosis not present

## 2020-06-28 DIAGNOSIS — R488 Other symbolic dysfunctions: Secondary | ICD-10-CM | POA: Diagnosis not present

## 2020-06-28 DIAGNOSIS — R278 Other lack of coordination: Secondary | ICD-10-CM | POA: Diagnosis not present

## 2020-06-28 DIAGNOSIS — R262 Difficulty in walking, not elsewhere classified: Secondary | ICD-10-CM | POA: Diagnosis not present

## 2020-06-28 DIAGNOSIS — M6281 Muscle weakness (generalized): Secondary | ICD-10-CM | POA: Diagnosis not present

## 2020-06-29 DIAGNOSIS — M6281 Muscle weakness (generalized): Secondary | ICD-10-CM | POA: Diagnosis not present

## 2020-06-29 DIAGNOSIS — R262 Difficulty in walking, not elsewhere classified: Secondary | ICD-10-CM | POA: Diagnosis not present

## 2020-06-29 DIAGNOSIS — R488 Other symbolic dysfunctions: Secondary | ICD-10-CM | POA: Diagnosis not present

## 2020-06-29 DIAGNOSIS — R278 Other lack of coordination: Secondary | ICD-10-CM | POA: Diagnosis not present

## 2020-07-04 DIAGNOSIS — R488 Other symbolic dysfunctions: Secondary | ICD-10-CM | POA: Diagnosis not present

## 2020-07-04 DIAGNOSIS — M6281 Muscle weakness (generalized): Secondary | ICD-10-CM | POA: Diagnosis not present

## 2020-07-04 DIAGNOSIS — R278 Other lack of coordination: Secondary | ICD-10-CM | POA: Diagnosis not present

## 2020-07-04 DIAGNOSIS — R262 Difficulty in walking, not elsewhere classified: Secondary | ICD-10-CM | POA: Diagnosis not present

## 2020-07-06 DIAGNOSIS — R488 Other symbolic dysfunctions: Secondary | ICD-10-CM | POA: Diagnosis not present

## 2020-07-06 DIAGNOSIS — M6281 Muscle weakness (generalized): Secondary | ICD-10-CM | POA: Diagnosis not present

## 2020-07-06 DIAGNOSIS — R262 Difficulty in walking, not elsewhere classified: Secondary | ICD-10-CM | POA: Diagnosis not present

## 2020-07-06 DIAGNOSIS — R278 Other lack of coordination: Secondary | ICD-10-CM | POA: Diagnosis not present

## 2020-07-07 DIAGNOSIS — R278 Other lack of coordination: Secondary | ICD-10-CM | POA: Diagnosis not present

## 2020-07-07 DIAGNOSIS — M6281 Muscle weakness (generalized): Secondary | ICD-10-CM | POA: Diagnosis not present

## 2020-07-07 DIAGNOSIS — R262 Difficulty in walking, not elsewhere classified: Secondary | ICD-10-CM | POA: Diagnosis not present

## 2020-07-07 DIAGNOSIS — R488 Other symbolic dysfunctions: Secondary | ICD-10-CM | POA: Diagnosis not present

## 2020-07-11 DIAGNOSIS — R488 Other symbolic dysfunctions: Secondary | ICD-10-CM | POA: Diagnosis not present

## 2020-07-11 DIAGNOSIS — I739 Peripheral vascular disease, unspecified: Secondary | ICD-10-CM | POA: Diagnosis not present

## 2020-07-11 DIAGNOSIS — G309 Alzheimer's disease, unspecified: Secondary | ICD-10-CM | POA: Diagnosis not present

## 2020-07-11 DIAGNOSIS — S0081XA Abrasion of other part of head, initial encounter: Secondary | ICD-10-CM | POA: Diagnosis not present

## 2020-07-11 DIAGNOSIS — R278 Other lack of coordination: Secondary | ICD-10-CM | POA: Diagnosis not present

## 2020-07-11 DIAGNOSIS — R262 Difficulty in walking, not elsewhere classified: Secondary | ICD-10-CM | POA: Diagnosis not present

## 2020-07-11 DIAGNOSIS — F0281 Dementia in other diseases classified elsewhere with behavioral disturbance: Secondary | ICD-10-CM | POA: Diagnosis not present

## 2020-07-11 DIAGNOSIS — M6281 Muscle weakness (generalized): Secondary | ICD-10-CM | POA: Diagnosis not present

## 2020-07-11 DIAGNOSIS — Q845 Enlarged and hypertrophic nails: Secondary | ICD-10-CM | POA: Diagnosis not present

## 2020-07-11 DIAGNOSIS — W19XXXA Unspecified fall, initial encounter: Secondary | ICD-10-CM | POA: Diagnosis not present

## 2020-07-14 DIAGNOSIS — M6281 Muscle weakness (generalized): Secondary | ICD-10-CM | POA: Diagnosis not present

## 2020-07-14 DIAGNOSIS — R488 Other symbolic dysfunctions: Secondary | ICD-10-CM | POA: Diagnosis not present

## 2020-07-14 DIAGNOSIS — R278 Other lack of coordination: Secondary | ICD-10-CM | POA: Diagnosis not present

## 2020-07-14 DIAGNOSIS — R262 Difficulty in walking, not elsewhere classified: Secondary | ICD-10-CM | POA: Diagnosis not present

## 2020-07-18 DIAGNOSIS — F0281 Dementia in other diseases classified elsewhere with behavioral disturbance: Secondary | ICD-10-CM | POA: Diagnosis not present

## 2020-07-18 DIAGNOSIS — R262 Difficulty in walking, not elsewhere classified: Secondary | ICD-10-CM | POA: Diagnosis not present

## 2020-07-18 DIAGNOSIS — R4689 Other symptoms and signs involving appearance and behavior: Secondary | ICD-10-CM | POA: Diagnosis not present

## 2020-07-18 DIAGNOSIS — F331 Major depressive disorder, recurrent, moderate: Secondary | ICD-10-CM | POA: Diagnosis not present

## 2020-07-18 DIAGNOSIS — R278 Other lack of coordination: Secondary | ICD-10-CM | POA: Diagnosis not present

## 2020-07-18 DIAGNOSIS — R488 Other symbolic dysfunctions: Secondary | ICD-10-CM | POA: Diagnosis not present

## 2020-07-18 DIAGNOSIS — G309 Alzheimer's disease, unspecified: Secondary | ICD-10-CM | POA: Diagnosis not present

## 2020-07-18 DIAGNOSIS — M6281 Muscle weakness (generalized): Secondary | ICD-10-CM | POA: Diagnosis not present

## 2020-07-19 DIAGNOSIS — R278 Other lack of coordination: Secondary | ICD-10-CM | POA: Diagnosis not present

## 2020-07-19 DIAGNOSIS — M6281 Muscle weakness (generalized): Secondary | ICD-10-CM | POA: Diagnosis not present

## 2020-07-19 DIAGNOSIS — R262 Difficulty in walking, not elsewhere classified: Secondary | ICD-10-CM | POA: Diagnosis not present

## 2020-07-19 DIAGNOSIS — R488 Other symbolic dysfunctions: Secondary | ICD-10-CM | POA: Diagnosis not present

## 2020-07-28 DIAGNOSIS — R488 Other symbolic dysfunctions: Secondary | ICD-10-CM | POA: Diagnosis not present

## 2020-07-28 DIAGNOSIS — M6281 Muscle weakness (generalized): Secondary | ICD-10-CM | POA: Diagnosis not present

## 2020-07-28 DIAGNOSIS — R278 Other lack of coordination: Secondary | ICD-10-CM | POA: Diagnosis not present

## 2020-07-28 DIAGNOSIS — R262 Difficulty in walking, not elsewhere classified: Secondary | ICD-10-CM | POA: Diagnosis not present

## 2020-08-22 DIAGNOSIS — G309 Alzheimer's disease, unspecified: Secondary | ICD-10-CM | POA: Diagnosis not present

## 2020-08-22 DIAGNOSIS — F0281 Dementia in other diseases classified elsewhere with behavioral disturbance: Secondary | ICD-10-CM | POA: Diagnosis not present

## 2020-08-22 DIAGNOSIS — F331 Major depressive disorder, recurrent, moderate: Secondary | ICD-10-CM | POA: Diagnosis not present

## 2020-08-22 DIAGNOSIS — E039 Hypothyroidism, unspecified: Secondary | ICD-10-CM | POA: Diagnosis not present

## 2020-09-21 DIAGNOSIS — M6281 Muscle weakness (generalized): Secondary | ICD-10-CM | POA: Diagnosis not present

## 2020-09-21 DIAGNOSIS — R600 Localized edema: Secondary | ICD-10-CM | POA: Diagnosis not present

## 2020-09-21 DIAGNOSIS — G309 Alzheimer's disease, unspecified: Secondary | ICD-10-CM | POA: Diagnosis not present

## 2020-09-26 DIAGNOSIS — F0281 Dementia in other diseases classified elsewhere with behavioral disturbance: Secondary | ICD-10-CM | POA: Diagnosis not present

## 2020-09-26 DIAGNOSIS — G309 Alzheimer's disease, unspecified: Secondary | ICD-10-CM | POA: Diagnosis not present

## 2020-09-26 DIAGNOSIS — Z1159 Encounter for screening for other viral diseases: Secondary | ICD-10-CM | POA: Diagnosis not present

## 2020-09-26 DIAGNOSIS — Z20828 Contact with and (suspected) exposure to other viral communicable diseases: Secondary | ICD-10-CM | POA: Diagnosis not present

## 2020-09-26 DIAGNOSIS — U071 COVID-19: Secondary | ICD-10-CM | POA: Diagnosis not present

## 2020-09-26 DIAGNOSIS — F331 Major depressive disorder, recurrent, moderate: Secondary | ICD-10-CM | POA: Diagnosis not present

## 2020-10-22 DIAGNOSIS — R600 Localized edema: Secondary | ICD-10-CM | POA: Diagnosis not present

## 2020-10-22 DIAGNOSIS — G309 Alzheimer's disease, unspecified: Secondary | ICD-10-CM | POA: Diagnosis not present

## 2020-10-22 DIAGNOSIS — M6281 Muscle weakness (generalized): Secondary | ICD-10-CM | POA: Diagnosis not present

## 2020-10-31 ENCOUNTER — Other Ambulatory Visit: Payer: Self-pay

## 2020-10-31 ENCOUNTER — Non-Acute Institutional Stay: Payer: Medicare Other | Admitting: Hospice

## 2020-10-31 DIAGNOSIS — Z515 Encounter for palliative care: Secondary | ICD-10-CM

## 2020-10-31 DIAGNOSIS — R4589 Other symptoms and signs involving emotional state: Secondary | ICD-10-CM | POA: Diagnosis not present

## 2020-10-31 DIAGNOSIS — F0391 Unspecified dementia with behavioral disturbance: Secondary | ICD-10-CM | POA: Diagnosis not present

## 2020-10-31 NOTE — Progress Notes (Signed)
[facility] Consult Note Telephone: [phone]  Fax: [phone]  PATIENT NAME: Shauniece Kwan Schwartz DOB: 1946-02-20 MRN: [id number]  PRIMARY CARE PROVIDER:   Hulan Fess, MD Hulan Fess, MD [place],  [place] [address]  REFERRING PROVIDER: Hulan Fess, MD Hulan Fess, MD [place],  [place] [address]   RESPONSIBLE PARTY:(spouse)[phone] (H).(daughter)[place] [phone])   Visit is to build trust and highlight Palliative Medicine as specialized medical care for people living with serious illness, aimed at facilitating better quality of life through symptoms relief, assisting with advance care plan and establishing goals of care.   CHIEF COMPLAINT: Follow up palliative visit/moodiness  RECOMMENDATIONS/PLAN:   1. Advance Care Planning/Code Status: CODE STATUS reviewed today.  Patient is a DO NOT RESUSCITATE.  2. Goals of Care: Goals of care include to maximize quality of life and symptom management.  Visit consisted of counseling and education dealing with the complex and emotionally intense issues of symptom management and palliative care in the setting of serious and potentially life-threatening illness. Palliative care team will continue to support patient, patient's family, and medical team.  I spent 20 minutes providing this consultation. More than 50% of the time in this consultation was spent on coordinating communication.  -------------------------------------------------------------------------------------------------------------------------------------------------- 3. Symptom management/Plan:  Moodiness: Moodiness has improved.  Patient seen participating in facility musical show for [place] day.  Continue citalopram 200 mg daily.  Continue supportive care.  Psych consult as needed. Palliative will continue to monitor for symptom management/decline and make  recommendations as needed. Return 2 months or prn. Encouraged to call provider sooner with any concerns.   HISTORY OF PRESENT ILLNESS:  Meagan Schwartz is a 75 y.o. female with multiple medical problems including moodiness which is chronic, comes and goes, likely related to dementia and depression; worse when patient refuses her medications.  Patient is currently on citalopram, compliant and this is helpful. History of Alzheimer dementia - FAST 7a, depression, hypothyroidism.  History obtained from review of EMR, discussion with patient/family. Review and summarization of Epic records shows history from other than patient. Rest of 10 point ROS asked and negative. Palliative Care was asked to follow this patient by consultation request of Little, Lennette Bihari, MD to help address complex decision making in the context of goals of care.   CODE STATUS: DNR  PPS: 40%  HOSPICE ELIGIBILITY/DIAGNOSIS: TBD  PAST MEDICAL HISTORY:  Past Medical History:  Diagnosis Date  . Alzheimer disease ([place])   . Arthritis   . Hx of colonic polyps 12/27/2014  . Hyperlipidemia   . 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 Vitamin D deficiency     SOCIAL HX: @SOCX  Patient in memory care for ongoing care   FAMILY HX:  Family History  Problem Relation Age of Onset  . Colon cancer Mother   . Heart disease Mother   . Alzheimer's disease Father   . Heart disease Father   . Leukemia Brother   . Breast cancer Maternal Grandmother   . Breast cancer Paternal Grandmother   . Paget's disease of bone Cousin   . Paget's disease of bone Cousin   . Polycythemia Brother   . Graves' disease Daughter   . Graves' disease Son   . Rectal cancer Neg Hx   . Stomach cancer Neg Hx     Review lab tests/diagnostics No results for input(s): WBC, HGB, HCT, PLT, MCV in the last 168 hours. No results for input(s):  NA, K, CL, CO2, BUN, CREATININE, GLUCOSE in the last 168 hours. Latest GFR by Cockcroft Gault (not valid in AKI or  ESRD) CrCl cannot be calculated (Patient's most recent lab result is older than the maximum 21 days allowed.). No results for input(s): AST, ALT, ALKPHOS, GGT in the last 168 hours.  Invalid input(s): TBILI, CONJBILI, ALB, TOTALPROTEIN No components found for: ALB No results for input(s): APTT, INR in the last 168 hours.  Invalid input(s): PTPATIENT No results for input(s): BNP, PROBNP in the last 168 hours.  ALLERGIES:  Allergies  Allergen Reactions  . Clindamycin Other (See Comments)    Unknown reaction      PERTINENT MEDICATIONS:  Outpatient Encounter Medications as of 10/31/2020  Medication Sig  . citalopram (CELEXA) 10 MG tablet Take 2 tablets (20 mg total) by mouth daily.  Marland Kitchen donepezil (ARICEPT) 10 MG tablet TAKE 1 TABLET BY MOUTH AT BEDTIME (Patient taking differently: Take 10 mg by mouth at bedtime. )  . levothyroxine (SYNTHROID, LEVOTHROID) 75 MCG tablet Take 75 mcg by mouth daily before breakfast.   . Melatonin 5 MG TABS Take 10 mg by mouth at bedtime.   . memantine (NAMENDA) 10 MG tablet Take 1 tablet (10 mg total) by mouth 2 (two) times daily.  . Multiple Vitamins-Minerals (OCUVITE ADULT 50+) CAPS Take 1 capsule by mouth daily with lunch.  Marland Kitchen OVER THE COUNTER MEDICATION Take by mouth See admin instructions. LifeSeasons Recode Alzheimer's regimen of probotics, vitamins, omega 3, MCT oil, etc - take 8 capsules by mouth with each meal, Mix 2 scoops of morning balance powder in almond milk and drink every morning, mix 1 scoop of evening balance in water and drink every evening.  Marland Kitchen QUEtiapine (SEROQUEL) 25 MG tablet Take 1 tablet (25 mg total) by mouth 2 (two) times daily.   No facility-administered encounter medications on file as of 10/31/2020.    ROS  General: NAD EYES: denies vision changes ENMT: denies dysphagia no xerostomia Cardiovascular: denies chest pain Pulmonary: denies  cough, denies SOB  Abdomen: endorses fair appetite, no constipation or diarrhea GU: denies  dysuria or urinary frquency MSK:  endorses ROM limitations, no falls reported Skin: denies rashes or wounds Neurological: endorses weakness, denies pain, denies insomnia Psych: Endorses positive mood Heme/lymph/immuno: denies bruises, abnormal bleeding   PHYSICAL EXAM  General: In no acute distress,  Cardiovascular: regular rate and rhythm Pulmonary: no cough, no increased work of breathing, normal breath sounds/respiratory effort Abdomen: soft, non tender, positive bowel sounds in all quadrants GU:  no suprapubic tenderness Eyes: Normal lids, no discharge, sclera anicteric ENMT: Moist mucous membranes Musculoskeletal:  no edema in BLE Skin: no rash to visible skin, warm without cyanosis Psych: non-anxious affect, pleasant, seen with other residents participating in a [place] musical show Neurological: Weakness but otherwise non focal Heme/lymph/immuno: no bruises, no bleeding  Thank you for the opportunity to participate in the care of [facility] Please call our office at [phone] if we can be of additional assistance.  Note: Portions of this note were generated with Lobbyist. Dictation errors may occur despite best attempts at proofreading.  Teodoro Spray, NP

## 2020-11-11 DIAGNOSIS — Z20828 Contact with and (suspected) exposure to other viral communicable diseases: Secondary | ICD-10-CM | POA: Diagnosis not present

## 2020-11-11 DIAGNOSIS — Z1159 Encounter for screening for other viral diseases: Secondary | ICD-10-CM | POA: Diagnosis not present

## 2020-11-19 DIAGNOSIS — M6281 Muscle weakness (generalized): Secondary | ICD-10-CM | POA: Diagnosis not present

## 2020-11-19 DIAGNOSIS — R600 Localized edema: Secondary | ICD-10-CM | POA: Diagnosis not present

## 2020-11-19 DIAGNOSIS — G309 Alzheimer's disease, unspecified: Secondary | ICD-10-CM | POA: Diagnosis not present

## 2020-11-28 DIAGNOSIS — Z1159 Encounter for screening for other viral diseases: Secondary | ICD-10-CM | POA: Diagnosis not present

## 2020-11-28 DIAGNOSIS — Z20828 Contact with and (suspected) exposure to other viral communicable diseases: Secondary | ICD-10-CM | POA: Diagnosis not present

## 2020-12-01 DIAGNOSIS — Z20828 Contact with and (suspected) exposure to other viral communicable diseases: Secondary | ICD-10-CM | POA: Diagnosis not present

## 2020-12-01 DIAGNOSIS — Z1159 Encounter for screening for other viral diseases: Secondary | ICD-10-CM | POA: Diagnosis not present

## 2020-12-05 DIAGNOSIS — F0281 Dementia in other diseases classified elsewhere with behavioral disturbance: Secondary | ICD-10-CM | POA: Diagnosis not present

## 2020-12-05 DIAGNOSIS — F331 Major depressive disorder, recurrent, moderate: Secondary | ICD-10-CM | POA: Diagnosis not present

## 2020-12-05 DIAGNOSIS — G309 Alzheimer's disease, unspecified: Secondary | ICD-10-CM | POA: Diagnosis not present

## 2020-12-05 DIAGNOSIS — F5104 Psychophysiologic insomnia: Secondary | ICD-10-CM | POA: Diagnosis not present

## 2020-12-12 DIAGNOSIS — Z20828 Contact with and (suspected) exposure to other viral communicable diseases: Secondary | ICD-10-CM | POA: Diagnosis not present

## 2020-12-12 DIAGNOSIS — F0391 Unspecified dementia with behavioral disturbance: Secondary | ICD-10-CM | POA: Diagnosis not present

## 2020-12-12 DIAGNOSIS — F0281 Dementia in other diseases classified elsewhere with behavioral disturbance: Secondary | ICD-10-CM | POA: Diagnosis not present

## 2020-12-12 DIAGNOSIS — F331 Major depressive disorder, recurrent, moderate: Secondary | ICD-10-CM | POA: Diagnosis not present

## 2020-12-12 DIAGNOSIS — G309 Alzheimer's disease, unspecified: Secondary | ICD-10-CM | POA: Diagnosis not present

## 2020-12-12 DIAGNOSIS — Z1159 Encounter for screening for other viral diseases: Secondary | ICD-10-CM | POA: Diagnosis not present

## 2020-12-16 ENCOUNTER — Other Ambulatory Visit: Payer: Self-pay

## 2020-12-16 ENCOUNTER — Non-Acute Institutional Stay: Payer: Medicare Other | Admitting: Hospice

## 2020-12-16 DIAGNOSIS — F03911 Unspecified dementia, unspecified severity, with agitation: Secondary | ICD-10-CM

## 2020-12-16 DIAGNOSIS — F0391 Unspecified dementia with behavioral disturbance: Secondary | ICD-10-CM | POA: Diagnosis not present

## 2020-12-16 DIAGNOSIS — Z515 Encounter for palliative care: Secondary | ICD-10-CM | POA: Diagnosis not present

## 2020-12-16 NOTE — Progress Notes (Signed)
[facility] Consult Note Telephone: [phone]  Fax: [phone]  PATIENT NAME: Meagan Schwartz DOB: 02-19-1946 MRN: [id number]  PRIMARY CARE PROVIDER:   Hulan Fess, MD Hulan Fess, MD [place],  [place] [address]  REFERRING PROVIDER: Hulan Fess, MD Hulan Fess, MD [place],  [place] [address]   RESPONSIBLE PARTY:(spouse)[phone] (H).(daughter)[place] [phone])    Visit is to build trust and highlight Palliative Medicine as specialized medical care for people living with serious illness, aimed at facilitating better quality of life through symptoms relief, assisting with advance care plan and establishing goals of care.   CHIEF COMPLAINT: Palliative follow up visit/agitation  RECOMMENDATIONS/PLAN:   1. Advance Care Planning/Code Status: Patient is a DO NOT RESUSCITATE  2. Goals of Care: Goals of care include to maximize quality of life and symptom management.  Visit consisted of counseling and education dealing with the complex and emotionally intense issues of symptom management and palliative care in the setting of serious and potentially life-threatening illness. Palliative care team will continue to support patient, patient's family, and medical team.  3. Symptom management/Plan:  Agitation related to dementia: Use calm approach distraction and de-escalation techniques.  Continue Seroquel as ordered.  Ensure fall and safety precautions.  Continue ongoing supportive care. Depression: Improved.  Patient participating in facility musical activity.  Continue citalopram.  Psych consult as needed. Palliative will continue to monitor for symptom management/decline and make recommendations as needed. Return 2 months or prn. Encouraged to call provider sooner with any concerns.   HISTORY OF PRESENT ILLNESS:  Meagan Schwartz is a 75 y.o. female with multiple medical  problems including agitation likely related to history of Alzheimer dementia, worse in the morning.  Patient gets agitated when she is woken up in the morning for hygiene care; she sometimes refuses care impairs her quality of life.  Nursing reports that when she gets her anti agitation medication and is bathed  and groomed in the morning, her agitation subsides and she is more cooperative.  History of Alzheimer dementia - FAST 7a, depression, hypothyroidism.  History obtained from review of EMR, discussion with primary team, and  interview with family, caregiver  and/or patient. Records reviewed and summarized above. All 10 point systems reviewed and are negative except as documented in history of present illness above  Review and summarization of Epic records shows history from other than patient.   Palliative Care was asked to follow this patient by consultation request of Little, Lennette Bihari, MD to help address complex decision making in the context of advance care planning and goals of care clarification.   CODE STATUS: DNR  PPS: 40%  HOSPICE ELIGIBILITY/DIAGNOSIS: TBD  PAST MEDICAL HISTORY:  Past Medical History:  Diagnosis Date  . Alzheimer disease ([place])   . Arthritis   . Hx of colonic polyps 12/27/2014  . Hyperlipidemia   . 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 Vitamin D deficiency      SOCIAL HX: @SOCX  Patient lives at [facility] for ongoing care   FAMILY HX:  Family History  Problem Relation Age of Onset  . Colon cancer Mother   . Heart disease Mother   . Alzheimer's disease Father   . Heart disease Father   . Leukemia Brother   . Breast cancer Maternal Grandmother   . Breast cancer Paternal Grandmother   . Paget's disease of bone Cousin   . Paget's disease of bone Cousin   .  Polycythemia Brother   . Graves' disease Daughter   . Graves' disease Son   . Rectal cancer Neg Hx   . Stomach cancer Neg Hx     Review lab tests/diagnostics No results for input(s): WBC, HGB,  HCT, PLT, MCV in the last 168 hours. No results for input(s): NA, K, CL, CO2, BUN, CREATININE, GLUCOSE in the last 168 hours. Latest GFR by Cockcroft Gault (not valid in AKI or ESRD) CrCl cannot be calculated (Patient's most recent lab result is older than the maximum 21 days allowed.).  ALLERGIES:  Allergies  Allergen Reactions  . Clindamycin Other (See Comments)    Unknown reaction      PERTINENT MEDICATIONS:  Outpatient Encounter Medications as of 12/16/2020  Medication Sig  . citalopram (CELEXA) 10 MG tablet Take 2 tablets (20 mg total) by mouth daily.  Marland Kitchen donepezil (ARICEPT) 10 MG tablet TAKE 1 TABLET BY MOUTH AT BEDTIME (Patient taking differently: Take 10 mg by mouth at bedtime. )  . levothyroxine (SYNTHROID, LEVOTHROID) 75 MCG tablet Take 75 mcg by mouth daily before breakfast.   . Melatonin 5 MG TABS Take 10 mg by mouth at bedtime.   . memantine (NAMENDA) 10 MG tablet Take 1 tablet (10 mg total) by mouth 2 (two) times daily.  . Multiple Vitamins-Minerals (OCUVITE ADULT 50+) CAPS Take 1 capsule by mouth daily with lunch.  Marland Kitchen OVER THE COUNTER MEDICATION Take by mouth See admin instructions. Life[employer] Recode Alzheimer's regimen of probotics, vitamins, omega 3, MCT oil, etc - take 8 capsules by mouth with each meal, Mix 2 scoops of morning balance powder in almond milk and drink every morning, mix 1 scoop of evening balance in water and drink every evening.  Marland Kitchen QUEtiapine (SEROQUEL) 25 MG tablet Take 1 tablet (25 mg total) by mouth 2 (two) times daily.   No facility-administered encounter medications on file as of 12/16/2020.     PHYSICAL EXAM  General: In no acute distress,  Cardiovascular: regular rate and rhythm; no edema in BLE Pulmonary: no cough, no increased work of breathing, normal respiratory effort Abdomen: soft, non tender, positive bowel sounds in all quadrants GU:  no suprapubic tenderness Eyes: Normal lids, no discharge, sclera anicteric ENMT: Moist mucous  membranes Musculoskeletal: Weakness, mild sarcopenia Skin: no rash to visible skin, warm without cyanosis Psych: non-anxious affect Neurological: Weakness but otherwise non focal Heme/lymph/immuno: no bruises, no bleeding  I spent  55 minutes providing this consultation; time includes spent with patient/family, chart review and documentation. More than 50% of the time in this consultation was spent on coordinating communication   Thank you for the opportunity to participate in the care of [place] Please call our office at [phone] if we can be of additional assistance.  Note: Portions of this note were generated with Lobbyist. Dictation errors may occur despite best attempts at proofreading.  Teodoro Spray, NP

## 2020-12-19 DIAGNOSIS — Z1159 Encounter for screening for other viral diseases: Secondary | ICD-10-CM | POA: Diagnosis not present

## 2020-12-19 DIAGNOSIS — Z20828 Contact with and (suspected) exposure to other viral communicable diseases: Secondary | ICD-10-CM | POA: Diagnosis not present

## 2020-12-20 DIAGNOSIS — M6281 Muscle weakness (generalized): Secondary | ICD-10-CM | POA: Diagnosis not present

## 2020-12-20 DIAGNOSIS — R600 Localized edema: Secondary | ICD-10-CM | POA: Diagnosis not present

## 2020-12-20 DIAGNOSIS — G309 Alzheimer's disease, unspecified: Secondary | ICD-10-CM | POA: Diagnosis not present

## 2020-12-22 DIAGNOSIS — Z1159 Encounter for screening for other viral diseases: Secondary | ICD-10-CM | POA: Diagnosis not present

## 2020-12-22 DIAGNOSIS — Z20828 Contact with and (suspected) exposure to other viral communicable diseases: Secondary | ICD-10-CM | POA: Diagnosis not present

## 2020-12-26 ENCOUNTER — Ambulatory Visit: Payer: Medicare Other | Admitting: Adult Health

## 2020-12-26 DIAGNOSIS — Z20828 Contact with and (suspected) exposure to other viral communicable diseases: Secondary | ICD-10-CM | POA: Diagnosis not present

## 2020-12-26 DIAGNOSIS — F0281 Dementia in other diseases classified elsewhere with behavioral disturbance: Secondary | ICD-10-CM | POA: Diagnosis not present

## 2020-12-26 DIAGNOSIS — F0391 Unspecified dementia with behavioral disturbance: Secondary | ICD-10-CM | POA: Diagnosis not present

## 2020-12-26 DIAGNOSIS — Z1159 Encounter for screening for other viral diseases: Secondary | ICD-10-CM | POA: Diagnosis not present

## 2020-12-26 DIAGNOSIS — G309 Alzheimer's disease, unspecified: Secondary | ICD-10-CM | POA: Diagnosis not present

## 2020-12-26 DIAGNOSIS — F331 Major depressive disorder, recurrent, moderate: Secondary | ICD-10-CM | POA: Diagnosis not present

## 2021-01-02 DIAGNOSIS — Z1159 Encounter for screening for other viral diseases: Secondary | ICD-10-CM | POA: Diagnosis not present

## 2021-01-02 DIAGNOSIS — Z20828 Contact with and (suspected) exposure to other viral communicable diseases: Secondary | ICD-10-CM | POA: Diagnosis not present

## 2021-01-07 ENCOUNTER — Encounter (HOSPITAL_COMMUNITY): Payer: Self-pay

## 2021-01-07 ENCOUNTER — Emergency Department (HOSPITAL_COMMUNITY)
Admission: EM | Admit: 2021-01-07 | Discharge: 2021-01-07 | Disposition: A | Discharge status: Home / Self Care | Payer: Medicare Other | Attending: Emergency Medicine | Admitting: Emergency Medicine

## 2021-01-07 ENCOUNTER — Other Ambulatory Visit: Payer: Self-pay

## 2021-01-07 DIAGNOSIS — Z79899 Other long term (current) drug therapy: Secondary | ICD-10-CM | POA: Diagnosis not present

## 2021-01-07 DIAGNOSIS — S0003XA Contusion of scalp, initial encounter: Secondary | ICD-10-CM | POA: Insufficient documentation

## 2021-01-07 DIAGNOSIS — E039 Hypothyroidism, unspecified: Secondary | ICD-10-CM | POA: Diagnosis not present

## 2021-01-07 DIAGNOSIS — Z87891 Personal history of nicotine dependence: Secondary | ICD-10-CM | POA: Diagnosis not present

## 2021-01-07 DIAGNOSIS — W1839XA Other fall on same level, initial encounter: Secondary | ICD-10-CM | POA: Insufficient documentation

## 2021-01-07 DIAGNOSIS — F0281 Dementia in other diseases classified elsewhere with behavioral disturbance: Secondary | ICD-10-CM | POA: Diagnosis not present

## 2021-01-07 DIAGNOSIS — G301 Alzheimer's disease with late onset: Secondary | ICD-10-CM | POA: Diagnosis not present

## 2021-01-07 DIAGNOSIS — R9431 Abnormal electrocardiogram [ECG] [EKG]: Secondary | ICD-10-CM | POA: Diagnosis not present

## 2021-01-07 DIAGNOSIS — Y9289 Other specified places as the place of occurrence of the external cause: Secondary | ICD-10-CM | POA: Insufficient documentation

## 2021-01-07 DIAGNOSIS — S0083XA Contusion of other part of head, initial encounter: Secondary | ICD-10-CM | POA: Diagnosis not present

## 2021-01-07 DIAGNOSIS — S0093XA Contusion of unspecified part of head, initial encounter: Secondary | ICD-10-CM

## 2021-01-07 DIAGNOSIS — S0990XA Unspecified injury of head, initial encounter: Secondary | ICD-10-CM | POA: Diagnosis present

## 2021-01-07 DIAGNOSIS — T1490XA Injury, unspecified, initial encounter: Secondary | ICD-10-CM | POA: Diagnosis not present

## 2021-01-07 MED ORDER — OLANZAPINE 5 MG PO TABS
5.0000 mg | ORAL_TABLET | Freq: Once | ORAL | Status: AC
  Administered 2021-01-07: 5 mg via ORAL
  Filled 2021-01-07: qty 1

## 2021-01-07 NOTE — ED Notes (Signed)
md at bedside

## 2021-01-07 NOTE — Discharge Instructions (Addendum)
Return at any time if you need additional assistance your symptoms worsen or if you have any additional concerns.

## 2021-01-07 NOTE — ED Notes (Signed)
Called for [facility] transport at 17:50

## 2021-01-07 NOTE — ED Provider Notes (Signed)
[facility] Provider Note   CSN: [id number] Arrival date & time: 01/07/21  1611     History Chief Complaint  Patient presents with  . Fall    Meagan Schwartz is a 75 y.o. female.  Patient reportedly had a fall today.  Presents from nursing home.  She has a history of significant dementia unable to give additional history or review of systems.  Per nursing report no additional reported fevers or cough or vomiting or diarrhea.        Past Medical History:  Diagnosis Date  . Alzheimer disease ([place])   . Arthritis   . Hx of colonic polyps 12/27/2014  . Hyperlipidemia   . 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 Vitamin D deficiency     Patient Active Problem List   Diagnosis Date Noted  . UTI (urinary tract infection) 03/05/2020  . Sepsis ([place]) 03/05/2020  . Elevated troponin 03/05/2020  . Hypothyroidism 03/05/2020  . Dementia of the Alzheimer's type, with late onset, with depressed mood ([place]) 10/14/2017  . Cognitive deficits 02/20/2017  . Postural dizziness with presyncope 02/20/2017  . Hx of colonic polyps 10/04/2009    Past Surgical History:  Procedure Laterality Date  . BREAST BIOPSY Left 2016   benign  . COLONOSCOPY  10-04-2009  . VAGINAL DELIVERY     x2     OB History    Gravida  2   Para  2   Term  2   Preterm  0   AB  0   Living  2     SAB  0   IAB  0   Ectopic  0   Multiple  0   Live Births  2           Family History  Problem Relation Age of Onset  . Colon cancer Mother   . Heart disease Mother   . Alzheimer's disease Father   . Heart disease Father   . Leukemia Brother   . Breast cancer Maternal Grandmother   . Breast cancer Paternal Grandmother   . Paget's disease of bone Cousin   . Paget's disease of bone Cousin   . Polycythemia Brother   . Graves' disease Daughter   . Graves' disease Son   . Rectal cancer Neg Hx   . Stomach cancer Neg Hx     Social History   Tobacco  Use  . Smoking status: Former Research scientist (life sciences)  . Smokeless tobacco: Never Used  . Tobacco comment: smoked while in college  Vaping Use  . Vaping Use: Never used  Substance Use Topics  . Alcohol use: Not Currently  . Drug use: No    Home Medications Prior to Admission medications   Medication Sig Start Date End Date Taking? Authorizing Provider  citalopram (CELEXA) 10 MG tablet Take 2 tablets (20 mg total) by mouth daily. 03/16/20   Ward Givens, NP  donepezil (ARICEPT) 10 MG tablet TAKE 1 TABLET BY MOUTH AT BEDTIME Patient taking differently: Take 10 mg by mouth at bedtime.  02/22/20   Ward Givens, NP  levothyroxine (SYNTHROID, LEVOTHROID) 75 MCG tablet Take 75 mcg by mouth daily before breakfast.  12/07/13   [provider]  Melatonin 5 MG TABS Take 10 mg by mouth at bedtime.     [provider]  memantine (NAMENDA) 10 MG tablet Take 1 tablet (10 mg total) by mouth 2 (two) times daily. 06/29/19   Ward Givens, NP  Multiple Vitamins-Minerals (OCUVITE ADULT 50+) CAPS Take 1 capsule by mouth daily with lunch.    [provider]  OVER THE COUNTER MEDICATION Take by mouth See admin instructions. [employer] Recode Alzheimer's regimen of probotics, vitamins, omega 3, MCT oil, etc - take 8 capsules by mouth with each meal, Mix 2 scoops of morning balance powder in almond milk and drink every morning, mix 1 scoop of evening balance in water and drink every evening.    [provider]  QUEtiapine (SEROQUEL) 25 MG tablet Take 1 tablet (25 mg total) by mouth 2 (two) times daily. 04/04/20   Ward Givens, NP    Allergies    Clindamycin  Review of Systems   Review of Systems  Unable to perform ROS: Dementia    Physical Exam Updated Vital Signs BP (!) 159/119   Pulse 75   Resp 19   Ht 5\' 4"  (1.626 m)   Wt 53 kg   LMP 09/17/1996 (Approximate)   SpO2 99%   BMI 20.06 kg/m   Physical Exam Constitutional:      General: She is not in acute distress.     Appearance: Normal appearance.  HENT:     Head: Normocephalic.     Comments: Right frontal temporal region has a 2 x 3 cm hematoma.  No laceration noted.    Nose: Nose normal.  Eyes:     Extraocular Movements: Extraocular movements intact.  Cardiovascular:     Rate and Rhythm: Normal rate.  Pulmonary:     Effort: Pulmonary effort is normal.  Musculoskeletal:        General: Normal range of motion.     Cervical back: Normal range of motion.     Comments: Patient able to range her shoulders elbows wrists neck and hips knees well without pain or discomfort on my exam.  Neurological:     General: No focal deficit present.     Mental Status: She is alert. Mental status is at baseline.     Comments: No focal deficit noted.  Patient is uncooperative with exam given history of significant dementia but appears to be moving all extremities and does not appear in pain.     ED Results / Procedures / Treatments   Labs (all labs ordered are listed, but only abnormal results are displayed) Labs Reviewed - No data to display  EKG EKG Interpretation  Date/Time:  Saturday January 07 2021 16:22:00 EDT Ventricular Rate:  78 PR Interval:  179 QRS Duration: 102 QT Interval:  378 QTC Calculation: 431 R Axis:   -28 Text Interpretation: Sinus rhythm Borderline left axis deviation Low voltage, precordial leads Borderline T abnormalities, anterior leads Baseline wander in lead(s) I II aVR aVL aVF V1 Confirmed by Thamas Jaegers ([id number]) on 01/07/2021 5:28:43 PM   Radiology No results found.  Procedures Procedures   Medications Ordered in ED Medications - No data to display  ED Course  I have reviewed the triage vital signs and the nursing notes.  Pertinent labs & imaging results that were available during my care of the patient were reviewed by me and considered in my medical decision making (see chart for details).    MDM Rules/Calculators/A&P                          Husband arrived, I  reviewed her advanced directive which states that she is to be comfort care.  He is agreeable to take her back to her  nursing home without any additional testing.  Patient presents with a fall with scalp hematoma discharged back to her nursing home in stable condition.  Final Clinical Impression(s) / ED Diagnoses Final diagnoses:  Contusion of head, unspecified part of head, initial encounter    Rx / DC Orders ED Discharge Orders    None       Luna Fuse, MD 01/07/21 1731

## 2021-01-07 NOTE — ED Notes (Signed)
DC instructions reviewed by previous RN.  AVS sent with PTAR.

## 2021-01-07 NOTE — ED Triage Notes (Signed)
Per [facility] pt coming from [facility] for fall. Hematoma to right temple, right collarbone pain, left forearm abrasion. HX od dementia. Denies LOC, no thinners, 156/90 hr 68, rr20, spo2 95% on room air. DNR and Most form at bedside.

## 2021-01-09 DIAGNOSIS — G309 Alzheimer's disease, unspecified: Secondary | ICD-10-CM | POA: Diagnosis not present

## 2021-01-09 DIAGNOSIS — F331 Major depressive disorder, recurrent, moderate: Secondary | ICD-10-CM | POA: Diagnosis not present

## 2021-01-09 DIAGNOSIS — F0281 Dementia in other diseases classified elsewhere with behavioral disturbance: Secondary | ICD-10-CM | POA: Diagnosis not present

## 2021-01-09 DIAGNOSIS — W19XXXA Unspecified fall, initial encounter: Secondary | ICD-10-CM | POA: Diagnosis not present

## 2021-01-19 DIAGNOSIS — Z1159 Encounter for screening for other viral diseases: Secondary | ICD-10-CM | POA: Diagnosis not present

## 2021-01-19 DIAGNOSIS — Z20828 Contact with and (suspected) exposure to other viral communicable diseases: Secondary | ICD-10-CM | POA: Diagnosis not present

## 2021-01-19 DIAGNOSIS — M6281 Muscle weakness (generalized): Secondary | ICD-10-CM | POA: Diagnosis not present

## 2021-01-19 DIAGNOSIS — G309 Alzheimer's disease, unspecified: Secondary | ICD-10-CM | POA: Diagnosis not present

## 2021-01-19 DIAGNOSIS — R600 Localized edema: Secondary | ICD-10-CM | POA: Diagnosis not present

## 2021-01-26 DIAGNOSIS — Z20828 Contact with and (suspected) exposure to other viral communicable diseases: Secondary | ICD-10-CM | POA: Diagnosis not present

## 2021-01-26 DIAGNOSIS — Z1159 Encounter for screening for other viral diseases: Secondary | ICD-10-CM | POA: Diagnosis not present

## 2021-02-06 DIAGNOSIS — F331 Major depressive disorder, recurrent, moderate: Secondary | ICD-10-CM | POA: Diagnosis not present

## 2021-02-06 DIAGNOSIS — F0281 Dementia in other diseases classified elsewhere with behavioral disturbance: Secondary | ICD-10-CM | POA: Diagnosis not present

## 2021-02-06 DIAGNOSIS — Z79899 Other long term (current) drug therapy: Secondary | ICD-10-CM | POA: Diagnosis not present

## 2021-02-06 DIAGNOSIS — G309 Alzheimer's disease, unspecified: Secondary | ICD-10-CM | POA: Diagnosis not present

## 2021-02-16 DIAGNOSIS — Z20828 Contact with and (suspected) exposure to other viral communicable diseases: Secondary | ICD-10-CM | POA: Diagnosis not present

## 2021-02-16 DIAGNOSIS — Z1159 Encounter for screening for other viral diseases: Secondary | ICD-10-CM | POA: Diagnosis not present

## 2021-02-23 DIAGNOSIS — Z20828 Contact with and (suspected) exposure to other viral communicable diseases: Secondary | ICD-10-CM | POA: Diagnosis not present

## 2021-02-23 DIAGNOSIS — Z1159 Encounter for screening for other viral diseases: Secondary | ICD-10-CM | POA: Diagnosis not present

## 2021-03-21 NOTE — Telephone Encounter (Signed)
close

## 2021-08-14 DIAGNOSIS — Z20828 Contact with and (suspected) exposure to other viral communicable diseases: Secondary | ICD-10-CM | POA: Diagnosis not present

## 2021-08-14 DIAGNOSIS — Z1159 Encounter for screening for other viral diseases: Secondary | ICD-10-CM | POA: Diagnosis not present

## 2021-08-17 DIAGNOSIS — R059 Cough, unspecified: Secondary | ICD-10-CM | POA: Diagnosis not present

## 2021-08-17 DIAGNOSIS — R0989 Other specified symptoms and signs involving the circulatory and respiratory systems: Secondary | ICD-10-CM | POA: Diagnosis not present

## 2021-08-21 DIAGNOSIS — Z20828 Contact with and (suspected) exposure to other viral communicable diseases: Secondary | ICD-10-CM | POA: Diagnosis not present

## 2021-08-21 DIAGNOSIS — Z1159 Encounter for screening for other viral diseases: Secondary | ICD-10-CM | POA: Diagnosis not present

## 2021-08-28 DIAGNOSIS — Z20828 Contact with and (suspected) exposure to other viral communicable diseases: Secondary | ICD-10-CM | POA: Diagnosis not present

## 2021-08-28 DIAGNOSIS — Z1159 Encounter for screening for other viral diseases: Secondary | ICD-10-CM | POA: Diagnosis not present

## 2021-09-27 DIAGNOSIS — Z1159 Encounter for screening for other viral diseases: Secondary | ICD-10-CM | POA: Diagnosis not present

## 2021-09-27 DIAGNOSIS — Z20828 Contact with and (suspected) exposure to other viral communicable diseases: Secondary | ICD-10-CM | POA: Diagnosis not present

## 2022-11-22 DIAGNOSIS — L97511 Non-pressure chronic ulcer of other part of right foot limited to breakdown of skin: Secondary | ICD-10-CM | POA: Diagnosis not present

## 2022-11-22 DIAGNOSIS — M79675 Pain in left toe(s): Secondary | ICD-10-CM | POA: Diagnosis not present

## 2022-11-22 DIAGNOSIS — B351 Tinea unguium: Secondary | ICD-10-CM | POA: Diagnosis not present

## 2023-02-21 DIAGNOSIS — B351 Tinea unguium: Secondary | ICD-10-CM | POA: Diagnosis not present

## 2023-02-21 DIAGNOSIS — M79675 Pain in left toe(s): Secondary | ICD-10-CM | POA: Diagnosis not present

## 2023-03-25 DIAGNOSIS — F02818 Dementia in other diseases classified elsewhere, unspecified severity, with other behavioral disturbance: Secondary | ICD-10-CM | POA: Diagnosis not present

## 2023-03-25 DIAGNOSIS — G301 Alzheimer's disease with late onset: Secondary | ICD-10-CM | POA: Diagnosis not present

## 2023-03-25 DIAGNOSIS — R451 Restlessness and agitation: Secondary | ICD-10-CM | POA: Diagnosis not present

## 2023-03-25 DIAGNOSIS — R52 Pain, unspecified: Secondary | ICD-10-CM | POA: Diagnosis not present

## 2023-05-23 DIAGNOSIS — B351 Tinea unguium: Secondary | ICD-10-CM | POA: Diagnosis not present

## 2023-05-23 DIAGNOSIS — M79675 Pain in left toe(s): Secondary | ICD-10-CM | POA: Diagnosis not present

## 2023-05-27 DIAGNOSIS — E039 Hypothyroidism, unspecified: Secondary | ICD-10-CM | POA: Diagnosis not present

## 2023-05-27 DIAGNOSIS — G301 Alzheimer's disease with late onset: Secondary | ICD-10-CM | POA: Diagnosis not present

## 2023-05-27 DIAGNOSIS — G894 Chronic pain syndrome: Secondary | ICD-10-CM | POA: Diagnosis not present

## 2023-05-27 DIAGNOSIS — F02818 Dementia in other diseases classified elsewhere, unspecified severity, with other behavioral disturbance: Secondary | ICD-10-CM | POA: Diagnosis not present

## 2023-11-21 DIAGNOSIS — M79675 Pain in left toe(s): Secondary | ICD-10-CM | POA: Diagnosis not present

## 2023-11-21 DIAGNOSIS — B351 Tinea unguium: Secondary | ICD-10-CM | POA: Diagnosis not present

## 2024-01-13 DIAGNOSIS — I517 Cardiomegaly: Secondary | ICD-10-CM | POA: Diagnosis not present

## 2024-01-13 DIAGNOSIS — I509 Heart failure, unspecified: Secondary | ICD-10-CM | POA: Diagnosis not present

## 2024-02-20 DIAGNOSIS — B351 Tinea unguium: Secondary | ICD-10-CM | POA: Diagnosis not present

## 2024-02-20 DIAGNOSIS — M79675 Pain in left toe(s): Secondary | ICD-10-CM | POA: Diagnosis not present
